# Patient Record
Sex: Male | Born: 1975 | Race: White | Hispanic: No | Marital: Married | Smoking: Former smoker
Health system: Southern US, Community
[De-identification: ages and names within clinical notes are randomized; demographics above are authoritative.]

## PROBLEM LIST (undated history)

## (undated) DIAGNOSIS — A770 Spotted fever due to Rickettsia rickettsii: Secondary | ICD-10-CM

## (undated) DIAGNOSIS — I2510 Atherosclerotic heart disease of native coronary artery without angina pectoris: Secondary | ICD-10-CM

## (undated) DIAGNOSIS — E669 Obesity, unspecified: Secondary | ICD-10-CM

## (undated) DIAGNOSIS — I341 Nonrheumatic mitral (valve) prolapse: Secondary | ICD-10-CM

## (undated) DIAGNOSIS — W57XXXA Bitten or stung by nonvenomous insect and other nonvenomous arthropods, initial encounter: Secondary | ICD-10-CM

## (undated) HISTORY — DX: Atherosclerotic heart disease of native coronary artery without angina pectoris: I25.10

## (undated) HISTORY — DX: Nonrheumatic mitral (valve) prolapse: I34.1

## (undated) HISTORY — DX: Obesity, unspecified: E66.9

---

## 2016-11-18 ENCOUNTER — Emergency Department: Payer: BLUE CROSS/BLUE SHIELD

## 2016-11-18 ENCOUNTER — Inpatient Hospital Stay
Admission: EM | Admit: 2016-11-18 | Discharge: 2016-11-20 | DRG: 247 | Disposition: A | Discharge status: Home / Self Care | Payer: BLUE CROSS/BLUE SHIELD | Attending: Internal Medicine | Admitting: Internal Medicine

## 2016-11-18 ENCOUNTER — Encounter: Payer: Self-pay | Admitting: Emergency Medicine

## 2016-11-18 ENCOUNTER — Inpatient Hospital Stay
Admit: 2016-11-18 | Discharge: 2016-11-18 | Disposition: A | Discharge status: Home / Self Care | Payer: BLUE CROSS/BLUE SHIELD | Attending: Internal Medicine | Admitting: Internal Medicine

## 2016-11-18 DIAGNOSIS — I2510 Atherosclerotic heart disease of native coronary artery without angina pectoris: Secondary | ICD-10-CM | POA: Diagnosis present

## 2016-11-18 DIAGNOSIS — E785 Hyperlipidemia, unspecified: Secondary | ICD-10-CM | POA: Diagnosis present

## 2016-11-18 DIAGNOSIS — Z87891 Personal history of nicotine dependence: Secondary | ICD-10-CM

## 2016-11-18 DIAGNOSIS — I214 Non-ST elevation (NSTEMI) myocardial infarction: Secondary | ICD-10-CM | POA: Diagnosis present

## 2016-11-18 DIAGNOSIS — R739 Hyperglycemia, unspecified: Secondary | ICD-10-CM | POA: Diagnosis present

## 2016-11-18 DIAGNOSIS — I208 Other forms of angina pectoris: Secondary | ICD-10-CM | POA: Diagnosis not present

## 2016-11-18 DIAGNOSIS — Z8249 Family history of ischemic heart disease and other diseases of the circulatory system: Secondary | ICD-10-CM | POA: Diagnosis not present

## 2016-11-18 DIAGNOSIS — R072 Precordial pain: Secondary | ICD-10-CM | POA: Diagnosis not present

## 2016-11-18 HISTORY — DX: Bitten or stung by nonvenomous insect and other nonvenomous arthropods, initial encounter: W57.XXXA

## 2016-11-18 HISTORY — DX: Spotted fever due to Rickettsia rickettsii: A77.0

## 2016-11-18 LAB — COMPREHENSIVE METABOLIC PANEL
ALBUMIN: 4.2 g/dL (ref 3.5–5.0)
ALK PHOS: 53 U/L (ref 38–126)
ALT: 73 U/L — ABNORMAL HIGH (ref 17–63)
ANION GAP: 9 (ref 5–15)
AST: 72 U/L — AB (ref 15–41)
BILIRUBIN TOTAL: 0.5 mg/dL (ref 0.3–1.2)
BUN: 10 mg/dL (ref 6–20)
CALCIUM: 10.3 mg/dL (ref 8.9–10.3)
CO2: 29 mmol/L (ref 22–32)
Chloride: 101 mmol/L (ref 101–111)
Creatinine, Ser: 1.03 mg/dL (ref 0.61–1.24)
GFR calc Af Amer: >60 mL/min (ref >60)
GFR calc non Af Amer: >60 mL/min (ref >60)
GLUCOSE: 134 mg/dL — AB (ref 65–99)
Potassium: 4.3 mmol/L (ref 3.5–5.1)
SODIUM: 139 mmol/L (ref 135–145)
TOTAL PROTEIN: 7.6 g/dL (ref 6.5–8.1)

## 2016-11-18 LAB — LIPASE, BLOOD: Lipase: 26 U/L (ref 11–51)

## 2016-11-18 LAB — CBC WITH DIFFERENTIAL/PLATELET
BASOS ABS: 0 10*3/uL (ref 0–0.1)
BASOS PCT: 1 %
EOS ABS: 0 10*3/uL (ref 0–0.7)
Eosinophils Relative: 0 %
HEMATOCRIT: 44.1 % (ref 40.0–52.0)
HEMOGLOBIN: 15.2 g/dL (ref 13.0–18.0)
Lymphocytes Relative: 14 %
Lymphs Abs: 1 10*3/uL (ref 1.0–3.6)
MCH: 29.3 pg (ref 26.0–34.0)
MCHC: 34.6 g/dL (ref 32.0–36.0)
MCV: 84.8 fL (ref 80.0–100.0)
Monocytes Absolute: 1.2 10*3/uL — ABNORMAL HIGH (ref 0.2–1.0)
Monocytes Relative: 16 %
NEUTROS ABS: 5.3 10*3/uL (ref 1.4–6.5)
NEUTROS PCT: 69 %
Platelets: 173 10*3/uL (ref 150–440)
RBC: 5.2 MIL/uL (ref 4.40–5.90)
RDW: 13.5 % (ref 11.5–14.5)
WBC: 7.6 10*3/uL (ref 3.8–10.6)

## 2016-11-18 LAB — PROTIME-INR
INR: 1.02
PROTHROMBIN TIME: 13.4 s (ref 11.4–15.2)

## 2016-11-18 LAB — LIPID PANEL
Cholesterol: 157 mg/dL (ref 0–200)
HDL: 37 mg/dL — AB (ref >40)
LDL Cholesterol: 93 mg/dL (ref 0–99)
TRIGLYCERIDES: 137 mg/dL (ref <150)
Total CHOL/HDL Ratio: 4.2 RATIO
VLDL: 27 mg/dL (ref 0–40)

## 2016-11-18 LAB — APTT: APTT: 32 s (ref 24–36)

## 2016-11-18 LAB — TROPONIN I
TROPONIN I: 5.67 ng/mL — AB (ref <0.03)
Troponin I: 4.23 ng/mL (ref <0.03)
Troponin I: 4.85 ng/mL (ref <0.03)

## 2016-11-18 LAB — HEPARIN LEVEL (UNFRACTIONATED): HEPARIN UNFRACTIONATED: 0.2 [IU]/mL — AB (ref 0.30–0.70)

## 2016-11-18 IMAGING — CR DG CHEST 2V
2 series · 2 of 2 positions shown · non-contrast
Comparison: None.

CLINICAL DATA: Chest pain

EXAM:
CHEST  2 VIEW

[chest pa]
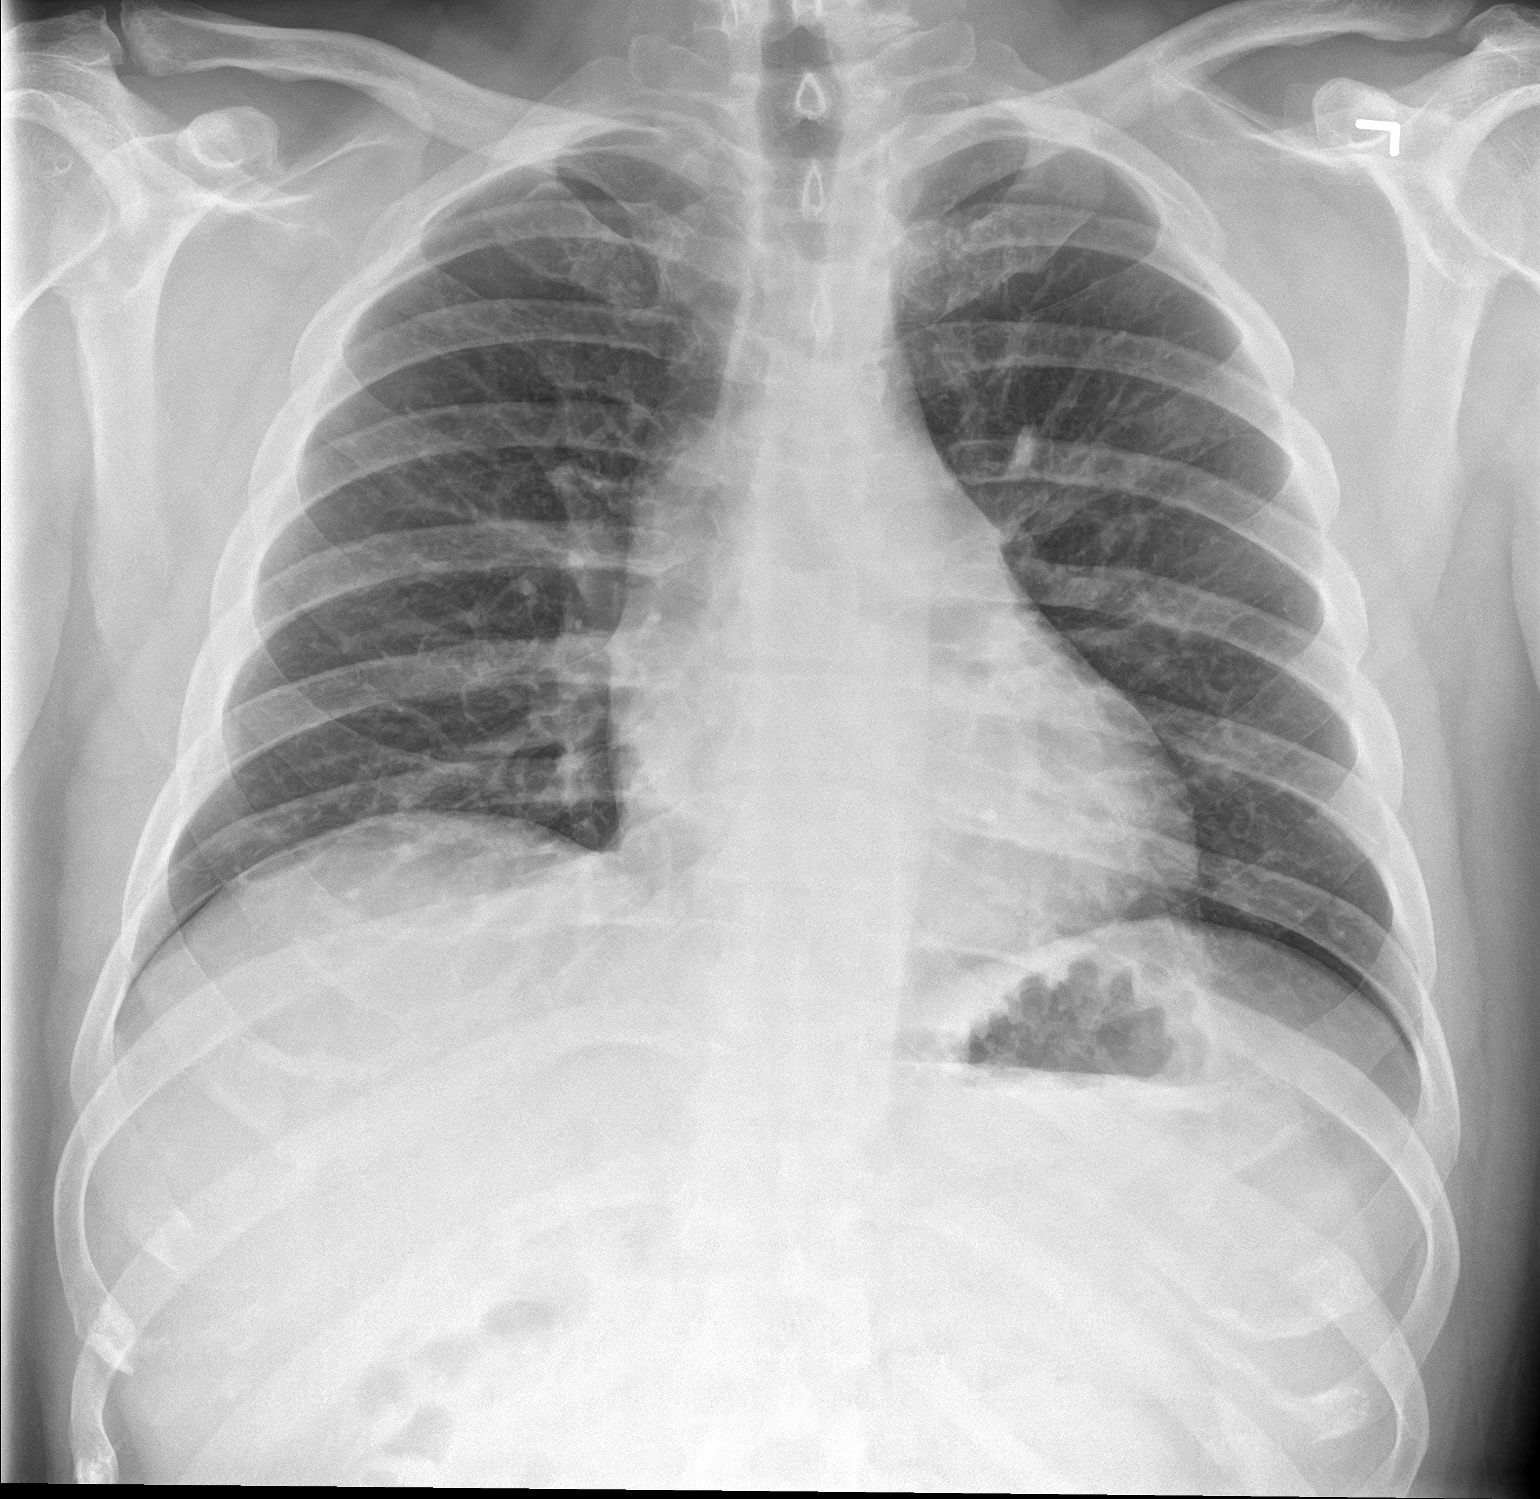

[chest lat]
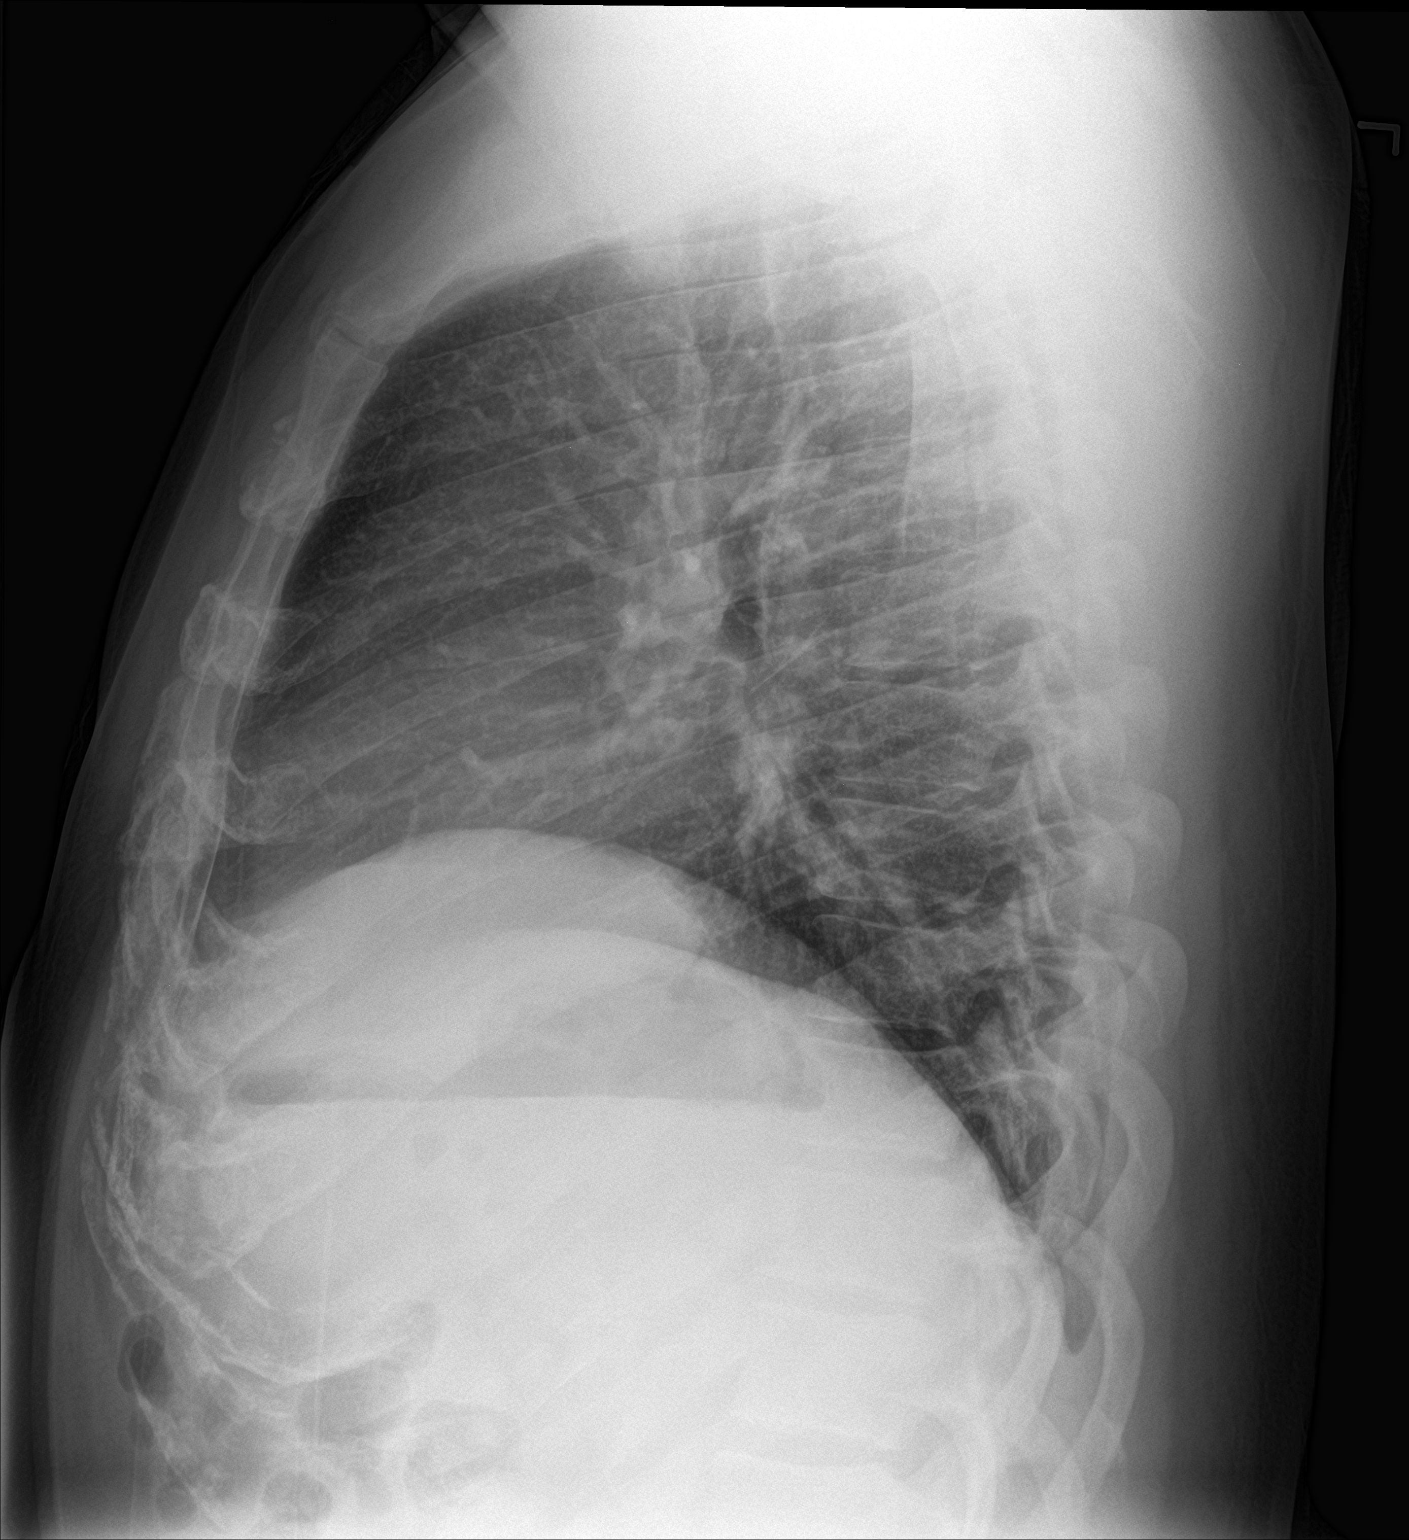

[2 of 2 positions shown; findings below may reference images not displayed]

FINDINGS: The heart size and mediastinal contours are within normal limits.
Both lungs are clear. The visualized skeletal structures are
unremarkable.
IMPRESSION: No active cardiopulmonary disease.

## 2016-11-18 MED ORDER — METOPROLOL TARTRATE 25 MG PO TABS
12.5000 mg | ORAL_TABLET | Freq: Two times a day (BID) | ORAL | Status: DC
  Administered 2016-11-18 – 2016-11-20 (×4): 12.5 mg via ORAL
  Filled 2016-11-18 (×4): qty 1

## 2016-11-18 MED ORDER — SODIUM CHLORIDE 0.9 % IV SOLN: 250.0000 mL | INTRAVENOUS | Status: DC | PRN

## 2016-11-18 MED ORDER — ATORVASTATIN CALCIUM 20 MG PO TABS
80.0000 mg | ORAL_TABLET | Freq: Every day | ORAL | Status: DC
  Administered 2016-11-18 – 2016-11-19 (×2): 80 mg via ORAL
  Filled 2016-11-18 (×2): qty 4

## 2016-11-18 MED ORDER — SODIUM CHLORIDE 0.9 % WEIGHT BASED INFUSION
3.0000 mL/kg/h | INTRAVENOUS | Status: AC
  Administered 2016-11-19: 3 mL/kg/h via INTRAVENOUS

## 2016-11-18 MED ORDER — NITROGLYCERIN 0.4 MG SL SUBL: 0.4000 mg | SUBLINGUAL_TABLET | SUBLINGUAL | Status: DC | PRN

## 2016-11-18 MED ORDER — HEPARIN (PORCINE) IN NACL 100-0.45 UNIT/ML-% IJ SOLN
13.0000 [IU]/kg/h | INTRAMUSCULAR | Status: DC
  Administered 2016-11-18: 13 [IU]/kg/h via INTRAVENOUS
  Filled 2016-11-18: qty 250

## 2016-11-18 MED ORDER — FAMOTIDINE IN NACL 20-0.9 MG/50ML-% IV SOLN
20.0000 mg | Freq: Once | INTRAVENOUS | Status: AC
  Administered 2016-11-18: 20 mg via INTRAVENOUS
  Filled 2016-11-18: qty 50

## 2016-11-18 MED ORDER — ASPIRIN 81 MG PO CHEW
81.0000 mg | CHEWABLE_TABLET | ORAL | Status: AC
  Administered 2016-11-19: 81 mg via ORAL
  Filled 2016-11-18: qty 1

## 2016-11-18 MED ORDER — ACETAMINOPHEN 325 MG PO TABS: 650.0000 mg | ORAL_TABLET | Freq: Four times a day (QID) | ORAL | Status: DC | PRN

## 2016-11-18 MED ORDER — ASPIRIN 81 MG PO CHEW: 81.0000 mg | CHEWABLE_TABLET | Freq: Every day | ORAL | Status: DC

## 2016-11-18 MED ORDER — ASPIRIN 81 MG PO CHEW
81.0000 mg | CHEWABLE_TABLET | Freq: Every day | ORAL | Status: DC
  Administered 2016-11-19 – 2016-11-20 (×2): 81 mg via ORAL
  Filled 2016-11-18 (×2): qty 1

## 2016-11-18 MED ORDER — ONDANSETRON HCL 4 MG/2ML IJ SOLN: 4.0000 mg | Freq: Four times a day (QID) | INTRAMUSCULAR | Status: DC | PRN

## 2016-11-18 MED ORDER — ALUM & MAG HYDROXIDE-SIMETH 200-200-20 MG/5ML PO SUSP
30.0000 mL | Freq: Four times a day (QID) | ORAL | Status: DC | PRN
Start: 2016-11-18 — End: 2016-11-20

## 2016-11-18 MED ORDER — ASPIRIN 81 MG PO CHEW
324.0000 mg | CHEWABLE_TABLET | Freq: Once | ORAL | Status: AC
  Administered 2016-11-18: 324 mg via ORAL
  Filled 2016-11-18: qty 4

## 2016-11-18 MED ORDER — HEPARIN (PORCINE) IN NACL 100-0.45 UNIT/ML-% IJ SOLN
1550.0000 [IU]/h | INTRAMUSCULAR | Status: DC
  Administered 2016-11-18 – 2016-11-19 (×2): 1550 [IU]/h via INTRAVENOUS
  Filled 2016-11-18: qty 250

## 2016-11-18 MED ORDER — ONDANSETRON HCL 4 MG/2ML IJ SOLN
4.0000 mg | Freq: Once | INTRAMUSCULAR | Status: AC
  Administered 2016-11-18: 4 mg via INTRAVENOUS
  Filled 2016-11-18: qty 2

## 2016-11-18 MED ORDER — HEPARIN BOLUS VIA INFUSION
1500.0000 [IU] | Freq: Once | INTRAVENOUS | Status: AC
  Administered 2016-11-18: 1500 [IU] via INTRAVENOUS
  Filled 2016-11-18: qty 1500

## 2016-11-18 MED ORDER — GI COCKTAIL ~~LOC~~
30.0000 mL | Freq: Once | ORAL | Status: AC
  Administered 2016-11-18: 30 mL via ORAL
  Filled 2016-11-18: qty 30

## 2016-11-18 MED ORDER — HEPARIN BOLUS VIA INFUSION
4000.0000 [IU] | Freq: Once | INTRAVENOUS | Status: AC
  Administered 2016-11-18: 4000 [IU] via INTRAVENOUS
  Filled 2016-11-18: qty 4000

## 2016-11-18 MED ORDER — TRAMADOL HCL 50 MG PO TABS: 50.0000 mg | ORAL_TABLET | Freq: Four times a day (QID) | ORAL | Status: DC | PRN

## 2016-11-18 MED ORDER — SODIUM CHLORIDE 0.9% FLUSH: 3.0000 mL | INTRAVENOUS | Status: DC | PRN

## 2016-11-18 MED ORDER — SODIUM CHLORIDE 0.9 % IV BOLUS (SEPSIS)
1000.0000 mL | Freq: Once | INTRAVENOUS | Status: AC
  Administered 2016-11-18: 1000 mL via INTRAVENOUS

## 2016-11-18 MED ORDER — PANTOPRAZOLE SODIUM 40 MG PO TBEC
40.0000 mg | DELAYED_RELEASE_TABLET | Freq: Every day | ORAL | Status: DC
  Administered 2016-11-18 – 2016-11-20 (×3): 40 mg via ORAL
  Filled 2016-11-18 (×2): qty 1

## 2016-11-18 MED ORDER — SODIUM CHLORIDE 0.9% FLUSH
3.0000 mL | Freq: Two times a day (BID) | INTRAVENOUS | Status: DC
  Administered 2016-11-18: 3 mL via INTRAVENOUS

## 2016-11-18 MED ORDER — SODIUM CHLORIDE 0.9 % WEIGHT BASED INFUSION: 1.0000 mL/kg/h | INTRAVENOUS | Status: DC

## 2016-11-18 MED ORDER — ACETAMINOPHEN 650 MG RE SUPP: 650.0000 mg | Freq: Four times a day (QID) | RECTAL | Status: DC | PRN

## 2016-11-18 MED ORDER — ONDANSETRON HCL 4 MG PO TABS: 4.0000 mg | ORAL_TABLET | Freq: Four times a day (QID) | ORAL | Status: DC | PRN

## 2016-11-18 NOTE — H&P (Signed)
[facility] - [place] at [facility]   PATIENT NAME: Victor Ibarra    MR#:  [id number]  DATE OF BIRTH:  1976-01-20  DATE OF ADMISSION:  11/18/2016  PRIMARY CARE PHYSICIAN: Patient, No Pcp Per   REQUESTING/REFERRING PHYSICIAN: Dr. Nita Sickle  CHIEF COMPLAINT:   Chief Complaint  Patient presents with  . Headache  . Fever  . Rash    HISTORY OF PRESENT ILLNESS:  Victor Ibarra  is a 41 y.o. male with No significant past medical history comes to the hospital secondary to worsening chest pain/burning sensation since yesterday. Patient is very healthy at baseline, independent and does exertional activity at his job without any complaints. 2 months ago he had tick bites with rash and fever and was treated with doxycycline. Did not have any complaints up until 4 days ago when he started having headaches and malaise. Low-grade fevers at home as well. Yesterday he felt like he had burning sensation in his chest and took some ibuprofen and Tums. Symptoms have resolved at the time but he woke up around 5 AM this morning with intense burning sensation in his chest associated with diaphoresis, presented to the ER. He was given GI cocktail with improvement. However his labs came back with elevated troponin of 4. EKG without any acute findings. He has significant family history with his dad being diagnosed with CAD in his early 39s. He started on heparin drip and is being admitted.  PAST MEDICAL HISTORY:   Past Medical History:  Diagnosis Date  . [facility] spotted fever    a. Dx 09/2016 - s/p 2 wks of doxycycline.  . Tick bite     PAST SURGICAL HISTORY:  History reviewed. No pertinent surgical history.  SOCIAL HISTORY:   Social History  Substance Use Topics  . Smoking status: Former Games developer  . Smokeless tobacco: Never Used     Comment: smoked some in high school - none since.  . Alcohol use Yes     Comment: 3-4 drinks on the weekends    FAMILY  HISTORY:   Family History  Problem Relation Age of Onset  . Other Mother        alive and well.  . Hypertension Mother   . CAD Father        s/p stenting in his 33's. Told he also had a CTO with prob prior silent MI.  . Melanoma Father   . Other Sister        alive and well.    DRUG ALLERGIES:  No Known Allergies  REVIEW OF SYSTEMS:   Review of Systems  Constitutional: Positive for malaise/fatigue. Negative for chills, fever and weight loss.  HENT: Negative for ear discharge, ear pain, hearing loss, nosebleeds and tinnitus.   Eyes: Negative for blurred vision, double vision and photophobia.  Respiratory: Negative for cough, hemoptysis, shortness of breath and wheezing.   Cardiovascular: Positive for chest pain. Negative for palpitations, orthopnea and leg swelling.  Gastrointestinal: Positive for heartburn. Negative for abdominal pain, constipation, diarrhea, melena, nausea and vomiting.  Genitourinary: Negative for dysuria, frequency, hematuria and urgency.  Musculoskeletal: Negative for back pain, myalgias and neck pain.  Skin: Negative for rash.  Neurological: Positive for headaches. Negative for dizziness, tingling, tremors, sensory change, speech change and focal weakness.  Endo/Heme/Allergies: Does not bruise/bleed easily.  Psychiatric/Behavioral: Negative for depression.    MEDICATIONS AT HOME:   Prior to Admission medications   Not on File      VITAL  SIGNS:  Blood pressure 127/84, pulse 65, temperature 98.9 F (37.2 C), temperature source Oral, resp. rate 18, height 6' (1.829 m), weight 117.9 kg (260 lb), SpO2 95 %.  PHYSICAL EXAMINATION:   Physical Exam  GENERAL:  41 y.o.-year-old patient lying in the bed with no acute distress.  EYES: Pupils equal, round, reactive to light and accommodation. No scleral icterus. Extraocular muscles intact.  HEENT: Head atraumatic, normocephalic. Oropharynx and nasopharynx clear.  NECK:  Supple, no jugular venous  distention. No thyroid enlargement, no tenderness.  LUNGS: Normal breath sounds bilaterally, no wheezing, rales,rhonchi or crepitation. No use of accessory muscles of respiration.  CARDIOVASCULAR: S1, S2 normal. No murmurs, rubs, or gallops.  ABDOMEN: Soft, nontender, nondistended. Bowel sounds present. No organomegaly or mass.  EXTREMITIES: No pedal edema, cyanosis, or clubbing.  NEUROLOGIC: Cranial nerves II through XII are intact. Muscle strength 5/5 in all extremities. Sensation intact. Gait not checked.  PSYCHIATRIC: The patient is alert and oriented x 3.  SKIN: No obvious rash, lesion, or ulcer.   LABORATORY PANEL:   CBC  Recent Labs Lab 11/18/16 0806  WBC 7.6  HGB 15.2  HCT 44.1  PLT 173   ------------------------------------------------------------------------------------------------------------------  Chemistries   Recent Labs Lab 11/18/16 0806  NA 139  K 4.3  CL 101  CO2 29  GLUCOSE 134*  BUN 10  CREATININE 1.03  CALCIUM 10.3  AST 72*  ALT 73*  ALKPHOS 53  BILITOT 0.5   ------------------------------------------------------------------------------------------------------------------  Cardiac Enzymes  Recent Labs Lab 11/18/16 0806  TROPONINI 4.23*   ------------------------------------------------------------------------------------------------------------------  RADIOLOGY:  Dg Chest 2 View  Result Date: 11/18/2016 CLINICAL DATA:  Chest pain EXAM: CHEST  2 VIEW COMPARISON:  None. FINDINGS: The heart size and mediastinal contours are within normal limits. Both lungs are clear. The visualized skeletal structures are unremarkable. IMPRESSION: No active cardiopulmonary disease. Electronically Signed   By: Alcide CleverMark  Lukens M.D.   On: 11/18/2016 08:37    EKG:   Orders placed or performed during the hospital encounter of 11/18/16  . EKG 12-Lead  . EKG 12-Lead  . ED EKG  . ED EKG  . EKG 12-Lead  . EKG 12-Lead    IMPRESSION AND PLAN:   Victor CobbWilliam  Ibarra  is a 41 y.o. male with No significant past medical history comes to the hospital secondary to worsening chest pain/burning sensation since yesterday.  #1 NSTEMI- strong family history, admit to tele - recycle troponins, lipid panel ordered - start asa and nitro prn - cardiology consulted, ECHO today - for cath in AM  #2 history of [place] spotted fever-treated with doxycycline in May 2018. - no active rash or fevers now  #3 DVT Prophylaxis- on heparin drip    All the records are reviewed and case discussed with ED provider. Management plans discussed with the patient, family and they are in agreement.  CODE STATUS: Full Code  TOTAL TIME TAKING CARE OF THIS PATIENT: 45 minutes.    Enid BaasKALISETTI,Abdoulaye Drum M.D on 11/18/2016 at 12:51 PM  Between 7am to 6pm - Pager - [phone]  After 6pm go to [email or web address] - password [email or web address]  [facility]  Office  [phone]  CC: Primary care physician; Patient, No Pcp Per

## 2016-11-18 NOTE — Consult Note (Signed)
Cardiology Consult    Patient ID: [name] MRN: [id number], DOB/AGE: [birth year]   Admit date: 11/18/2016 Date of Consult: 11/18/2016  Primary Physician: Patient, No Pcp Per Primary Cardiologist: New - seen by M. Kirke Corin, MD  Requesting Provider: Henreitta Leber  Patient Profile    Victor Ibarra. is a 41 y.o. male without prior cardiac history, who is being seen today for the evaluation of chest pain/NSTEMI at the request of Dr. Henreitta Leber.  Past Medical History   Past Medical History:  Diagnosis Date  . [facility] spotted fever    a. Dx 09/2016 - s/p 2 wks of doxycycline.  . Tick bite     History reviewed. No pertinent surgical history.   Allergies  No Known Allergies  History of Present Illness    41 y/o male without prior cardiac history.  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  He exerts heavily @ work and has noted that over the past 2 years, he has had some reduction in exercise tolerance and DOE.  He attributed this to getting older.  In late May, he found a tick on him and subsequently developed severe malaise and flu-like symptoms.  He was seen by his PCP within 2 days and was treated with doxycycline and OTC analgesics.  Following recovery, he did well and returned to work.  Beginning on ~ 7/12 however, he again noted malaise and fatigue.  He was concerned that he may have had another tick bite.  He continued to feel poorly over the weekend and used some OTC ibuprofen, which didn't seem to help much.  On 7/15, he had an episode of chest burning some time around 1500, though this resolved pretty quickly.  At 2200, he had more severe chest burning.  He took tums without relief and then took additional tums around 2300.  Symptoms finally abated around 0300 this morning and he was able to fall off to sleep.  Unfortunately, he awoke @ 0500 this am with recurrent chest burning.  He finally decided to come into the ED.  Here,  ECG showed subtle inferolateral ST changes.  Initial troponin was elevated @ 4.23.  He was placed on heparin and is currently chest pain free.  We have been asked to eval.  Inpatient Medications    . pantoprazole  40 mg Oral Daily    Family History    Family History  Problem Relation Age of Onset  . Other Mother        alive and well.  Marland Kitchen CAD Father        s/p stenting in his 50's. Told he also had a CTO with prob prior silent MI.  . Other Sister        alive and well.    Social History    Social History   Social History  . Marital status: Married    Spouse name: N/A  . Number of children: N/A  . Years of education: N/A   Occupational History  . [employer]    Social History Main Topics  . Smoking status: Former Games developer  . Smokeless tobacco: Never Used     Comment: smoked some in high school - none since.  . Alcohol use Yes     Comment: 3-4 drinks on the weekends  . Drug use: No  . Sexual activity: Not on file   Other Topics Concern  . Not on file   Social History  Narrative   [place]ives in [place].  Works as Landtobacco farmer - heavy exertion.     Review of Systems    General:  No chills, fever, night sweats or weight changes.  Cardiovascular:  +++ chest pain, +++ dyspnea on exertion, no edema, orthopnea, palpitations, paroxysmal nocturnal dyspnea. Dermatological: No rash, lesions/masses Respiratory: No cough, +++ dyspnea on exertion. Urologic: No hematuria, dysuria Abdominal:   No nausea, vomiting, diarrhea, bright red blood per rectum, melena, or hematemesis Neurologic:  No visual changes, wkns, changes in mental status. All other systems reviewed and are otherwise negative except as noted above.  Physical Exam    Blood pressure 127/84, pulse 65, temperature 98.9 F (37.2 C), temperature source Oral, resp. rate 18, height 6' (1.829 m), weight 260 lb (117.9 kg), SpO2 95 %.  General: Pleasant, NAD Psych: Normal affect. Neuro: Alert and oriented X  3. Moves all extremities spontaneously. HEENT: Normal  Neck: Supple without bruits or JVD. Lungs:  Resp regular and unlabored, CTA. Heart: RRR no s3, s4, or murmurs. Abdomen: Soft, non-tender, non-distended, BS + x 4.  Extremities: No clubbing, cyanosis or edema. DP/PT/Radials 2+ and equal bilaterally.  Nl Allen's on right.  Labs    Recent Labs  11/18/16 0806  TROPONINI 4.23*   Lab Results  Component Value Date   WBC 7.6 11/18/2016   HGB 15.2 11/18/2016   HCT 44.1 11/18/2016   MCV 84.8 11/18/2016   PLT 173 11/18/2016     Recent Labs Lab 11/18/16 0806  NA 139  K 4.3  CL 101  CO2 29  BUN 10  CREATININE 1.03  CALCIUM 10.3  PROT 7.6  BILITOT 0.5  ALKPHOS 53  ALT 73*  AST 72*  GLUCOSE 134*   Lab Results  Component Value Date   CHOL 157 11/18/2016   HDL 37 (L) 11/18/2016   LDLCALC 93 11/18/2016   TRIG 137 11/18/2016     Radiology Studies    Dg Chest 2 View  Result Date: 11/18/2016 CLINICAL DATA:  Chest pain EXAM: CHEST  2 VIEW COMPARISON:  None. FINDINGS: The heart size and mediastinal contours are within normal limits. Both lungs are clear. The visualized skeletal structures are unremarkable. IMPRESSION: No active cardiopulmonary disease. Electronically Signed   By: Alcide CleverMark  Lukens M.D.   On: 11/18/2016 08:37    ECG & Cardiac Imaging    RSR, 67, LAE, subtle St elev in II, aVF, V5-6.  Assessment & Plan    1.  NSTEMI:  Pt w/o prior cardiac hx, presented to the ED with a 4 day h/o malaise and fatigue followed by chest discomfort that worsened throughout the night.  Initial troponin in ED was 4.23.  He is currently chest pain free.  He does have a FH of premature CAD (father).  We discussed his presentation and objective findings at length.  We will plan on diagnostic catheterization in the AM.  The patient understands that risks include but are not limited to stroke (1 in 1000), death (1 in 1000), kidney failure [usually temporary] (1 in 500), bleeding (1 in 200),  allergic reaction [possibly serious] (1 in 200), and agrees to proceed.  Cont heparin, ASA.  I will add low dose  blocker and high potency statin.  Echo pending.  Eventual cardiac rehab.  2.  Lipids:  LDL 93  adding high potency statin in the setting of above.  Signed, Victor Duckinghristopher Breckyn Troyer, NP 11/18/2016, 12:41 PM

## 2016-11-18 NOTE — ED Notes (Signed)
Attempted to call report, nurse and charge nurse are unavailable.

## 2016-11-18 NOTE — ED Notes (Signed)
NSR noted on the monitor. Pt denies any pain at present. Will continue to monitor the pt.

## 2016-11-18 NOTE — ED Triage Notes (Signed)
Pt reports recently treated for RMSF. Pt has finished medication. Pt reports yesterday began to feel bad again. Pt reports headache, fever and burning in chest. Pt reports rash to right hip that looks like a red circle with brown in the middle.

## 2016-11-18 NOTE — Progress Notes (Signed)
ANTICOAGULATION CONSULT NOTE  Pharmacy Consult for Heparin Indication: chest pain/ACS  No Known Allergies  Patient Measurements: Height: 6' (182.9 cm) Weight: 260 lb (117.9 kg) IBW/kg (Calculated) : 77.6 Heparin Dosing Weight: 103.3 kg  Vital Signs: Temp: 98.9 F (37.2 C) (07/16 0753) Temp Source: Oral (07/16 0753) BP: 140/93 (07/16 0753) Pulse Rate: 73 (07/16 0753)  Labs:  Recent Labs  11/18/16 0806  HGB 15.2  HCT 44.1  PLT 173  CREATININE 1.03  TROPONINI 4.23*    Estimated Creatinine Clearance: 123.8 mL/min (by C-G formula based on SCr of 1.03 mg/dL).   Medical History: History reviewed. No pertinent past medical history.  Assessment: 41 y/o M with no PMH admitted with chest pain.   Goal of Therapy:  Heparin level 0.3-0.7 units/ml Monitor platelets by anticoagulation protocol: Yes   Plan:  Give 4000 units bolus x 1 Start heparin infusion at 1350 units/hr Check anti-Xa level in 6 hours and daily while on heparin Continue to monitor H&H and platelets  Luisa HartChristy, Mirella Gueye D 11/18/2016,9:00 AM

## 2016-11-18 NOTE — ED Provider Notes (Signed)
[facility] Emergency Department Provider Note  ____________________________________________  Time seen: Approximately 8:49 AM  I have reviewed the triage vital signs and the nursing notes.   HISTORY  Chief Complaint Headache; Fever; and Rash   HPI Victor Ibarra. is a 41 y.o. male with no significant past medical history who presents for evaluation of chest pain. Patient reports that yesterday evening he started having burning sensation in the center of his chest. He took 5 or 6 Tums and the pain went away. This morning when he woke up the pain was present again. He reports that the pain is different than prior episodes of reflux. He is currently complaining of moderate burning sensation located in the center of his chest that is nonradiating. No shortness of breath, no diaphoresis, no nausea or vomiting. Patient does endorse that he has taken 6 ibuprofen yesterday for headache and a temp of 100F. he also took them on an empty stomach. Patient denies history of peptic ulcer disease. He doesn't family history of heart attack in his bed. He is not a smoker.Patient also complaining that he has had a mild headache and a low-grade fever yesterday. He has a spot in his back and he is concerned could have been a tick bite. Patient was treated for RMSF fever 2 months ago.  History reviewed. No pertinent past medical history.  There are no active problems to display for this patient.   History reviewed. No pertinent surgical history.  Prior to Admission medications   Not on File    Allergies Patient has no known allergies.  No family history on file.  Social History Social History  Substance Use Topics  . Smoking status: Never Smoker  . Smokeless tobacco: Never Used  . Alcohol use Yes    Review of Systems  Constitutional: Negative for fever. Eyes: Negative for visual changes. ENT: Negative for sore throat. Neck: No neck pain    Cardiovascular: + chest pain. Respiratory: Negative for shortness of breath. Gastrointestinal: Negative for abdominal pain, vomiting or diarrhea. Genitourinary: Negative for dysuria. Musculoskeletal: Negative for back pain. Skin: Negative for rash. Neurological: Negative for headaches, weakness or numbness. Psych: No SI or HI  ____________________________________________   PHYSICAL EXAM:  VITAL SIGNS: ED Triage Vitals  Enc Vitals Group     BP 11/18/16 0753 (!) 140/93     Pulse Rate 11/18/16 0753 73     Resp 11/18/16 0753 18     Temp 11/18/16 0753 98.9 F (37.2 C)     Temp Source 11/18/16 0753 Oral     SpO2 11/18/16 0753 99 %     Weight 11/18/16 0753 260 lb (117.9 kg)     Height 11/18/16 0753 6' (1.829 m)     Head Circumference --      Peak Flow --      Pain Score 11/18/16 0752 4     Pain Loc --      Pain Edu? --      Excl. in GC? --     Constitutional: Alert and oriented. Well appearing and in no apparent distress. HEENT:      Head: Normocephalic and atraumatic.         Eyes: Conjunctivae are normal. Sclera is non-icteric.       Mouth/Throat: Mucous membranes are moist.       Neck: Supple with no signs of meningismus. Cardiovascular: Regular rate and rhythm. No murmurs, gallops, or rubs. 2+ symmetrical distal pulses are present in  all extremities. No JVD. Respiratory: Normal respiratory effort. Lungs are clear to auscultation bilaterally. No wheezes, crackles, or rhonchi.  Gastrointestinal: Soft, non tender, and non distended with positive bowel sounds. No rebound or guarding. Genitourinary: No CVA tenderness. Musculoskeletal: Nontender with normal range of motion in all extremities. No edema, cyanosis, or erythema of extremities. Neurologic: Normal speech and language. Face is symmetric. Moving all extremities. No gross focal neurologic deficits are appreciated. Skin: Skin is warm, dry and intact. No rash noted. Psychiatric: Mood and affect are normal. Speech and  behavior are normal.  ____________________________________________   LABS (all labs ordered are listed, but only abnormal results are displayed)  Labs Reviewed  CBC WITH DIFFERENTIAL/PLATELET - Abnormal; Notable for the following:       Result Value   Monocytes Absolute 1.2 (*)    All other components within normal limits  COMPREHENSIVE METABOLIC PANEL - Abnormal; Notable for the following:    Glucose, Bld 134 (*)    AST 72 (*)    ALT 73 (*)    All other components within normal limits  TROPONIN I - Abnormal; Notable for the following:    Troponin I 4.23 (*)    All other components within normal limits  LIPASE, BLOOD   ____________________________________________  EKG  ED ECG REPORT I, Nita Sickle, the attending physician, personally viewed and interpreted this ECG.  07:55 - normal sinus rhythm, rate of 67, normal intervals, normal axis, no ST elevations or depressions, T-wave inversion in lead 3  08:42 -  normal sinus rhythm, rate of 72, normal intervals, normal axis, persistent T wave inversion in lead 3, no ST elevations or depressions  ____________________________________________  RADIOLOGY  CXR: Negative  ____________________________________________   PROCEDURES  Procedure(s) performed: None Procedures Critical Care performed:  Yes  CRITICAL CARE Performed by: Nita Sickle  ?  Total critical care time: 35 min  Critical care time was exclusive of separately billable procedures and treating other patients.  Critical care was necessary to treat or prevent imminent or life-threatening deterioration.  Critical care was time spent personally by me on the following activities: development of treatment plan with patient and/or surrogate as well as nursing, discussions with consultants, evaluation of patient's response to treatment, examination of patient, obtaining history from patient or surrogate, ordering and performing treatments and interventions,  ordering and review of laboratory studies, ordering and review of radiographic studies, pulse oximetry and re-evaluation of patient's condition.  ____________________________________________   INITIAL IMPRESSION / ASSESSMENT AND PLAN / ED COURSE  41 y.o. male with no significant past medical history who presents for evaluation of chest burning in the setting of taking NSAIDs yesterday on an empty stomach. EKG with no ischemic changes. Patient currently complaining of a 5 out of 10 pain. We'll give GI cocktail, Pepcid, Zofran, IV fluids. We'll cycle cardiac enzymes due to patient's family history of heart disease.   _________________________ 8:49 AM on 11/18/2016 ----------------------------------------- Troponin elevated at 4.23. Patient is pain free after GI cocktail. A repeat EKG with no ischemic changes. Discussed with the hospitalist for admission. Patient given full dose aspirin. Discussed with Dr. Kirke Corin, cardiologist on call who recommended starting patient on a heparin drip. Patient be admitted for NSTEMI     Pertinent labs & imaging results that were available during my care of the patient were reviewed by me and considered in my medical decision making (see chart for details).    ____________________________________________   FINAL CLINICAL IMPRESSION(S) / ED DIAGNOSES  Final diagnoses:  NSTEMI (non-ST elevated myocardial infarction) (HCC)      NEW MEDICATIONS STARTED DURING THIS VISIT:  New Prescriptions   No medications on file     Note:  This document was prepared using Dragon voice recognition software and may include unintentional dictation errors.    Don PerkingVeronese, [place], MD 11/18/16 0900

## 2016-11-18 NOTE — Progress Notes (Signed)
ANTICOAGULATION CONSULT NOTE  Pharmacy Consult for Heparin Indication: chest pain/ACS  No Known Allergies  Patient Measurements: Height: 6' (182.9 cm) Weight: 260 lb (117.9 kg) IBW/kg (Calculated) : 77.6 Heparin Dosing Weight: 103.3 kg  Vital Signs: Temp: 98.9 F (37.2 C) (07/16 1151) Temp Source: Oral (07/16 1151) BP: 127/84 (07/16 1151) Pulse Rate: 65 (07/16 1151)  Labs:  Recent Labs  11/18/16 0806 11/18/16 0908 11/18/16 1315 11/18/16 1600  HGB 15.2  --   --   --   HCT 44.1  --   --   --   PLT 173  --   --   --   APTT  --  32  --   --   LABPROT  --  13.4  --   --   INR  --  1.02  --   --   HEPARINUNFRC  --   --   --  0.20*  CREATININE 1.03  --   --   --   TROPONINI 4.23*  --  5.67*  --     Estimated Creatinine Clearance: 123.8 mL/min (by C-G formula based on SCr of 1.03 mg/dL).   Medical History: Past Medical History:  Diagnosis Date  . [facility]    a. Dx 09/2016 - s/p 2 wks of doxycycline.  . Tick bite     Assessment: 41 y/o M with no PMH admitted with chest pain.   Goal of Therapy:  Heparin level 0.3-0.7 units/ml Monitor platelets by anticoagulation protocol: Yes   Plan:  Heparin level low at 0.20. Will bolus 1500 units and increase rate to 1550 units/hr. Recheck HL in 6 hours.  Olene FlossMelissa D Gearldean Lomanto, Pharm.D, BCPS Clinical Pharmacist  11/18/2016,4:52 PM

## 2016-11-19 ENCOUNTER — Encounter: Payer: Self-pay | Admitting: *Deleted

## 2016-11-19 ENCOUNTER — Encounter
Admission: EM | Disposition: A | Discharge status: Home / Self Care | Payer: Self-pay | Source: Home / Self Care | Attending: Internal Medicine

## 2016-11-19 DIAGNOSIS — R072 Precordial pain: Secondary | ICD-10-CM

## 2016-11-19 DIAGNOSIS — R739 Hyperglycemia, unspecified: Secondary | ICD-10-CM

## 2016-11-19 DIAGNOSIS — I2510 Atherosclerotic heart disease of native coronary artery without angina pectoris: Secondary | ICD-10-CM

## 2016-11-19 HISTORY — PX: CORONARY STENT INTERVENTION: CATH118234

## 2016-11-19 HISTORY — PX: LEFT HEART CATH AND CORONARY ANGIOGRAPHY: CATH118249

## 2016-11-19 HISTORY — PX: INTRAVASCULAR PRESSURE WIRE/FFR STUDY: CATH118243

## 2016-11-19 LAB — BASIC METABOLIC PANEL
ANION GAP: 7 (ref 5–15)
BUN: 12 mg/dL (ref 6–20)
CO2: 29 mmol/L (ref 22–32)
Calcium: 8.9 mg/dL (ref 8.9–10.3)
Chloride: 102 mmol/L (ref 101–111)
Creatinine, Ser: 1 mg/dL (ref 0.61–1.24)
GFR calc Af Amer: >60 mL/min (ref >60)
GLUCOSE: 103 mg/dL — AB (ref 65–99)
POTASSIUM: 3.9 mmol/L (ref 3.5–5.1)
SODIUM: 138 mmol/L (ref 135–145)

## 2016-11-19 LAB — ECHOCARDIOGRAM COMPLETE
HEIGHTINCHES: 72 in
Weight: 4160 oz

## 2016-11-19 LAB — HEPARIN LEVEL (UNFRACTIONATED)
HEPARIN UNFRACTIONATED: 0.33 [IU]/mL (ref 0.30–0.70)
Heparin Unfractionated: 0.35 IU/mL (ref 0.30–0.70)

## 2016-11-19 LAB — CBC
HEMATOCRIT: 41.9 % (ref 40.0–52.0)
HEMOGLOBIN: 14.1 g/dL (ref 13.0–18.0)
MCH: 29 pg (ref 26.0–34.0)
MCHC: 33.7 g/dL (ref 32.0–36.0)
MCV: 86.1 fL (ref 80.0–100.0)
Platelets: 167 10*3/uL (ref 150–440)
RBC: 4.87 MIL/uL (ref 4.40–5.90)
RDW: 13.7 % (ref 11.5–14.5)
WBC: 7.3 10*3/uL (ref 3.8–10.6)

## 2016-11-19 LAB — POCT ACTIVATED CLOTTING TIME
Activated Clotting Time: 252 seconds
Activated Clotting Time: 263 seconds

## 2016-11-19 LAB — TROPONIN I: TROPONIN I: 4.87 ng/mL — AB (ref <0.03)

## 2016-11-19 LAB — HIV ANTIBODY (ROUTINE TESTING W REFLEX): HIV Screen 4th Generation wRfx: NONREACTIVE

## 2016-11-19 SURGERY — LEFT HEART CATH AND CORONARY ANGIOGRAPHY
Anesthesia: Moderate Sedation

## 2016-11-19 MED ORDER — FENTANYL CITRATE (PF) 100 MCG/2ML IJ SOLN
INTRAMUSCULAR | Status: DC | PRN
  Administered 2016-11-19: 50 ug via INTRAVENOUS

## 2016-11-19 MED ORDER — FENTANYL CITRATE (PF) 100 MCG/2ML IJ SOLN
INTRAMUSCULAR | Status: AC
  Filled 2016-11-19: qty 2

## 2016-11-19 MED ORDER — SODIUM CHLORIDE 0.9 % IV SOLN: INTRAVENOUS | Status: AC

## 2016-11-19 MED ORDER — HEPARIN SODIUM (PORCINE) 1000 UNIT/ML IJ SOLN
INTRAMUSCULAR | Status: AC
  Filled 2016-11-19: qty 1

## 2016-11-19 MED ORDER — TICAGRELOR 90 MG PO TABS
ORAL_TABLET | ORAL | Status: DC | PRN
  Administered 2016-11-19: 180 mg via ORAL

## 2016-11-19 MED ORDER — SODIUM CHLORIDE 0.9% FLUSH: 3.0000 mL | INTRAVENOUS | Status: DC | PRN

## 2016-11-19 MED ORDER — VERAPAMIL HCL 2.5 MG/ML IV SOLN
INTRAVENOUS | Status: DC | PRN
  Administered 2016-11-19: 2.5 mg via INTRA_ARTERIAL

## 2016-11-19 MED ORDER — LABETALOL HCL 5 MG/ML IV SOLN: 10.0000 mg | INTRAVENOUS | Status: AC | PRN

## 2016-11-19 MED ORDER — SODIUM CHLORIDE 0.9% FLUSH
3.0000 mL | Freq: Two times a day (BID) | INTRAVENOUS | Status: DC
  Administered 2016-11-19: 3 mL via INTRAVENOUS

## 2016-11-19 MED ORDER — SODIUM CHLORIDE 0.9 % IV SOLN: 250.0000 mL | INTRAVENOUS | Status: DC | PRN

## 2016-11-19 MED ORDER — VERAPAMIL HCL 2.5 MG/ML IV SOLN
INTRAVENOUS | Status: AC
  Filled 2016-11-19: qty 2

## 2016-11-19 MED ORDER — NITROGLYCERIN 1 MG/10 ML FOR IR/CATH LAB
INTRA_ARTERIAL | Status: DC | PRN
  Administered 2016-11-19 (×2): 200 ug via INTRACORONARY

## 2016-11-19 MED ORDER — TICAGRELOR 90 MG PO TABS
ORAL_TABLET | ORAL | Status: AC
  Filled 2016-11-19: qty 2

## 2016-11-19 MED ORDER — NITROGLYCERIN 5 MG/ML IV SOLN
INTRAVENOUS | Status: AC
  Filled 2016-11-19: qty 10

## 2016-11-19 MED ORDER — HEPARIN (PORCINE) IN NACL 2-0.9 UNIT/ML-% IJ SOLN
INTRAMUSCULAR | Status: AC
  Filled 2016-11-19: qty 500

## 2016-11-19 MED ORDER — MIDAZOLAM HCL 2 MG/2ML IJ SOLN
INTRAMUSCULAR | Status: DC | PRN
Start: 2016-11-19 — End: 2016-11-19
  Administered 2016-11-19: 1 mg via INTRAVENOUS

## 2016-11-19 MED ORDER — ADENOSINE (DIAGNOSTIC) 140MCG/KG/MIN
INTRAVENOUS | Status: DC | PRN
  Administered 2016-11-19: 140 ug/kg/min via INTRAVENOUS

## 2016-11-19 MED ORDER — ENOXAPARIN SODIUM 40 MG/0.4ML ~~LOC~~ SOLN
40.0000 mg | SUBCUTANEOUS | Status: DC
Start: 2016-11-20 — End: 2016-11-20

## 2016-11-19 MED ORDER — HEPARIN SODIUM (PORCINE) 1000 UNIT/ML IJ SOLN
INTRAMUSCULAR | Status: DC | PRN
  Administered 2016-11-19: 5000 [IU] via INTRAVENOUS
  Administered 2016-11-19: 2000 [IU] via INTRAVENOUS
  Administered 2016-11-19: 6000 [IU] via INTRAVENOUS
  Administered 2016-11-19: 3000 [IU] via INTRAVENOUS

## 2016-11-19 MED ORDER — TICAGRELOR 90 MG PO TABS
90.0000 mg | ORAL_TABLET | Freq: Two times a day (BID) | ORAL | Status: DC
  Administered 2016-11-19 – 2016-11-20 (×2): 90 mg via ORAL
  Filled 2016-11-19 (×2): qty 1

## 2016-11-19 MED ORDER — HYDRALAZINE HCL 20 MG/ML IJ SOLN: 5.0000 mg | INTRAMUSCULAR | Status: AC | PRN

## 2016-11-19 MED ORDER — MIDAZOLAM HCL 2 MG/2ML IJ SOLN
INTRAMUSCULAR | Status: AC
  Filled 2016-11-19: qty 2

## 2016-11-19 MED ORDER — IOPAMIDOL (ISOVUE-300) INJECTION 61%
INTRAVENOUS | Status: DC | PRN
  Administered 2016-11-19: 305 mL via INTRA_ARTERIAL

## 2016-11-19 MED ORDER — ADENOSINE (DIAGNOSTIC) 3 MG/ML IV SOLN
INTRAVENOUS | Status: AC
  Filled 2016-11-19: qty 30

## 2016-11-19 SURGICAL SUPPLY — 15 items
BALLN TREK RX 2.25X12 (BALLOONS) ×2
BALLN ~~LOC~~ TREK RX 3.0X12 (BALLOONS) ×2
BALLOON TREK RX 2.25X12 (BALLOONS) ×1 IMPLANT
BALLOON ~~LOC~~ TREK RX 3.0X12 (BALLOONS) ×1 IMPLANT
CATH 5F 110X4 TIG (CATHETERS) ×2 IMPLANT
CATH INFINITI 5FR ANG PIGTAIL (CATHETERS) ×2 IMPLANT
CATH LAUNCHER 6FR EBU 3 (CATHETERS) ×2 IMPLANT
DEVICE INFLAT 30 PLUS (MISCELLANEOUS) ×2 IMPLANT
DEVICE RAD TR BAND REGULAR (VASCULAR PRODUCTS) ×2 IMPLANT
GLIDESHEATH SLEND SS 6F .021 (SHEATH) ×2 IMPLANT
KIT MANI 3VAL PERCEP (MISCELLANEOUS) ×2 IMPLANT
PACK CARDIAC CATH (CUSTOM PROCEDURE TRAY) ×2 IMPLANT
STENT XIENCE ALPINE RX 2.75X18 (Permanent Stent) ×2 IMPLANT
WIRE PRESSURE VERRATA (WIRE) ×2 IMPLANT
WIRE ROSEN-J .035X260CM (WIRE) ×2 IMPLANT

## 2016-11-19 NOTE — Interval H&P Note (Signed)
History and Physical Interval Note:  11/19/2016 9:38 AM  Victor BillWilliam Robert Baglio Jr.  has presented today for cardiac catheterization, with the diagnosis of NSTEMI. The various methods of treatment have been discussed with the patient and family. After consideration of risks, benefits and other options for treatment, the patient has consented to  Procedure(s): Left Heart Cath and Coronary Angiography (N/A) as a surgical intervention .  The patient's history has been reviewed, patient examined, no change in status, stable for surgery.  I have reviewed the patient's chart and labs.  Questions were answered to the patient's satisfaction.    Cath Lab Visit (complete for each Cath Lab visit)  Clinical Evaluation Leading to the Procedure:   ACS: Yes.    Non-ACS: N/A  Victor Ibarra

## 2016-11-19 NOTE — Progress Notes (Signed)
ANTICOAGULATION CONSULT NOTE  Pharmacy Consult for Heparin Indication: chest pain/ACS  No Known Allergies  Patient Measurements: Height: 6' (182.9 cm) Weight: 257 lb 8 oz (116.8 kg) IBW/kg (Calculated) : 77.6 Heparin Dosing Weight: 103.3 kg  Vital Signs: Temp: 98.7 F (37.1 C) (07/17 0509) Temp Source: Oral (07/17 0509) BP: 110/67 (07/17 0509) Pulse Rate: 67 (07/17 0509)  Labs:  Recent Labs  11/18/16 0806 11/18/16 0908 11/18/16 1315 11/18/16 1600 11/18/16 1821 11/19/16 0007 11/19/16 0604  HGB 15.2  --   --   --   --  14.1  --   HCT 44.1  --   --   --   --  41.9  --   PLT 173  --   --   --   --  167  --   APTT  --  32  --   --   --   --   --   LABPROT  --  13.4  --   --   --   --   --   INR  --  1.02  --   --   --   --   --   HEPARINUNFRC  --   --   --  0.20*  --  0.33 0.35  CREATININE 1.03  --   --   --   --  1.00  --   TROPONINI 4.23*  --  5.67*  --  4.85* 4.87*  --     Estimated Creatinine Clearance: 127 mL/min (by C-G formula based on SCr of 1 mg/dL).   Medical History: Past Medical History:  Diagnosis Date  . [facility]    a. Dx 09/2016 - s/p 2 wks of doxycycline.  . Tick bite     Assessment: 41 y/o M with no PMH admitted with chest pain.   Goal of Therapy:  Heparin level 0.3-0.7 units/ml Monitor platelets by anticoagulation protocol: Yes   Plan:  Heparin level low at 0.20. Will bolus 1500 units and increase rate to 1550 units/hr. Recheck HL in 6 hours.  7/17 @ 0000 HL 0.33 therapeutic. Will continue current rate and recheck next HL @ 0600.  7/17 @ 0600 HL 0.35 therapeutic. Will continue current rate and recheck next HL w/ am labs.  Thomasene Rippleavid Rory Xiang, PharmD, BCPS Clinical Pharmacist 11/19/2016

## 2016-11-19 NOTE — Plan of Care (Signed)
Problem: Pain Managment: Goal: General experience of comfort will improve Outcome: Progressing No complaints of pain this shift, will continue to monitor.  Problem: Tissue Perfusion: Goal: Risk factors for ineffective tissue perfusion will decrease Outcome: Progressing Heparin gtt

## 2016-11-19 NOTE — Progress Notes (Signed)
Patient Name: Victor Ibarra. Date of Encounter: 11/19/2016  Primary Cardiologist: New - consult by [facility] Problem List     Active Problems:   NSTEMI (non-ST elevated myocardial infarction) (HCC)     Subjective   No further chest pain. Echo with normal EF at 55-60% with hypokinesis of the inferolateral wall. He is for LHC this morning.   Inpatient Medications    Scheduled Meds: . aspirin  81 mg Oral Daily  . atorvastatin  80 mg Oral q1800  . metoprolol tartrate  12.5 mg Oral BID  . pantoprazole  40 mg Oral Daily  . sodium chloride flush  3 mL Intravenous Q12H   Continuous Infusions: . sodium chloride    . sodium chloride    . heparin 1,550 Units/hr (11/19/16 0007)   PRN Meds: sodium chloride, acetaminophen **OR** acetaminophen, alum & mag hydroxide-simeth, nitroGLYCERIN, ondansetron **OR** ondansetron (ZOFRAN) IV, sodium chloride flush, traMADol   Vital Signs    Vitals:   11/18/16 1130 11/18/16 1151 11/18/16 2005 11/19/16 0509  BP: 120/89 127/84 111/76 110/67  Pulse: 74 65 68 67  Resp: 17 18 16 17   Temp:  98.9 F (37.2 C) 98.9 F (37.2 C) 98.7 F (37.1 C)  TempSrc:  Oral Oral Oral  SpO2: 99% 95% 98% 98%  Weight:    257 lb 8 oz (116.8 kg)  Height:        Intake/Output Summary (Last 24 hours) at 11/19/16 0740 Last data filed at 11/19/16 0500  Gross per 24 hour  Intake          1526.29 ml  Output                0 ml  Net          1526.29 ml   Filed Weights   11/18/16 0753 11/19/16 0509  Weight: 260 lb (117.9 kg) 257 lb 8 oz (116.8 kg)    Physical Exam    GEN: Well nourished, well developed, in no acute distress.  HEENT: Grossly normal.  Neck: Supple, no JVD, carotid bruits, or masses. Cardiac: RRR, no murmurs, rubs, or gallops. No clubbing, cyanosis, edema.  Radials/DP/PT 2+ and equal bilaterally.  Respiratory:  Respirations regular and unlabored, clear to auscultation bilaterally. GI: Soft, nontender, nondistended, BS + x  4. MS: no deformity or atrophy. Skin: warm and dry, no rash. Neuro:  Strength and sensation are intact. Psych: AAOx3.  Normal affect.  Labs    CBC  Recent Labs  11/18/16 0806 11/19/16 0007  WBC 7.6 7.3  NEUTROABS 5.3  --   HGB 15.2 14.1  HCT 44.1 41.9  MCV 84.8 86.1  PLT 173 167   Basic Metabolic Panel  Recent Labs  11/18/16 0806 11/19/16 0007  NA 139 138  K 4.3 3.9  CL 101 102  CO2 29 29  GLUCOSE 134* 103*  BUN 10 12  CREATININE 1.03 1.00  CALCIUM 10.3 8.9   Liver Function Tests  Recent Labs  11/18/16 0806  AST 72*  ALT 73*  ALKPHOS 53  BILITOT 0.5  PROT 7.6  ALBUMIN 4.2    Recent Labs  11/18/16 0806  LIPASE 26   Cardiac Enzymes  Recent Labs  11/18/16 1315 11/18/16 1821 11/19/16 0007  TROPONINI 5.67* 4.85* 4.87*   BNP Invalid input(s): POCBNP D-Dimer No results for input(s): DDIMER in the last 72 hours. Hemoglobin A1C No results for input(s): HGBA1C in the last 72 hours. Fasting Lipid Panel  Recent  Labs  11/18/16 0806  CHOL 157  HDL 37*  LDLCALC 93  TRIG 213137  CHOLHDL 4.2   Thyroid Function Tests No results for input(s): TSH, T4TOTAL, T3FREE, THYROIDAB in the last 72 hours.  Invalid input(s): FREET3  Telemetry    NSR - Personally Reviewed  ECG    n/a - Personally Reviewed  Radiology    Dg Chest 2 View  Result Date: 11/18/2016 CLINICAL DATA:  Chest pain EXAM: CHEST  2 VIEW COMPARISON:  None. FINDINGS: The heart size and mediastinal contours are within normal limits. Both lungs are clear. The visualized skeletal structures are unremarkable. IMPRESSION: No active cardiopulmonary disease. Electronically Signed   By: Alcide CleverMark  Lukens M.D.   On: 11/18/2016 08:37    Cardiac Studies   TTE 11/18/16: Study Conclusions  - Left ventricle: The cavity size was at the upper limits of   normal. Wall thickness was increased in a pattern of mild LVH.   Systolic function was normal. The estimated ejection fraction was   in the range  of 55% to 60%. There is hypokinesis of the   inferolateral myocardium. Left ventricular diastolic function   parameters were normal. - Mitral valve: Mild prolapse. There was mild regurgitation. - Left atrium: The atrium was mildly dilated. - Right ventricle: The cavity size was normal. Wall thickness was   normal. Systolic function was normal.  Patient Profile     41 y.o. male without prior cardiac history, who was admitted with chest pain/NSTEMI.  Assessment & Plan    1. NSTEMI:  Pt w/o prior cardiac hx, presented to the ED with a 4 day h/o malaise and fatigue followed by chest discomfort that worsened throughout the night.  Initial troponin in ED was 4.23, with a peak of 5.67. Has been chest pain free.  He does have a FH of premature CAD (father). Plan for LHC this morning. Heparin gtt. ASA. Metoprolol. Lipitor. Risks and benefits of cardiac catheterization have been discussed with the patient including risks of bleeding, bruising, infection, kidney damage, stroke, heart attack, and death. The patient understands these risks and is willing to proceed with the procedure. All questions have been answered and concerns listened to.   2.  Lipids:  LDL 93  added high potency statin in the setting of above.   3. Hyperglycemia: Check HGB A1c.  Signed, Eula Listenyan Anslie Spadafora, PA-C [facility] Pager: [phone] 11/19/2016, 7:40 AM

## 2016-11-19 NOTE — Progress Notes (Signed)
[employer] [facility] [place] [facility]   PATIENT NAME: Victor CobbWilliam Ibarra    MR#:  [id number]  DATE OF BIRTH:  [birth year]  SUBJECTIVE:  CHIEF COMPLAINT:   Chief Complaint  Patient presents with  . Headache  . Fever  . Rash   - s/p cardiac cath and PCI today to LAD - feels better  REVIEW OF SYSTEMS:  Review of Systems  Constitutional: Negative for chills, fever and malaise/fatigue.  HENT: Negative for ear discharge, ear pain, hearing loss and nosebleeds.   Eyes: Negative for blurred vision and double vision.  Respiratory: Negative for cough, shortness of breath and wheezing.   Cardiovascular: Negative for chest pain, palpitations and leg swelling.  Gastrointestinal: Negative for abdominal pain, constipation, diarrhea, nausea and vomiting.  Genitourinary: Negative for dysuria.  Musculoskeletal: Negative for myalgias.  Neurological: Positive for headaches. Negative for dizziness, speech change, focal weakness and seizures.  Psychiatric/Behavioral: Negative for depression.    DRUG ALLERGIES:  No Known Allergies  VITALS:  Blood pressure (!) 117/93, pulse 71, temperature 98.6 F (37 C), temperature source Oral, resp. rate 17, height 6' (1.829 m), weight 116.6 kg (257 lb), SpO2 98 %.  PHYSICAL EXAMINATION:  Physical Exam  GENERAL:  41 y.o.-year-old patient lying in the bed with no acute distress.  EYES: Pupils equal, round, reactive to light and accommodation. No scleral icterus. Extraocular muscles intact.  HEENT: Head atraumatic, normocephalic. Oropharynx and nasopharynx clear.  NECK:  Supple, no jugular venous distention. No thyroid enlargement, no tenderness.  LUNGS: Normal breath sounds bilaterally, no wheezing, rales,rhonchi or crepitation. No use of accessory muscles of respiration.  CARDIOVASCULAR: S1, S2 normal. No murmurs, rubs, or gallops.  ABDOMEN: Soft, nontender, nondistended. Bowel sounds present. No organomegaly or mass.  EXTREMITIES: No pedal  edema, cyanosis, or clubbing. Right radial pressure dressing in place NEUROLOGIC: Cranial nerves II through XII are intact. Muscle strength 5/5 in all extremities. Sensation intact. Gait not checked.  PSYCHIATRIC: The patient is alert and oriented x 3.  SKIN: No obvious rash, lesion, or ulcer.    LABORATORY PANEL:   CBC  Recent Labs Lab 11/19/16 0007  WBC 7.3  HGB 14.1  HCT 41.9  PLT 167   ------------------------------------------------------------------------------------------------------------------  Chemistries   Recent Labs Lab 11/18/16 0806 11/19/16 0007  NA 139 138  K 4.3 3.9  CL 101 102  CO2 29 29  GLUCOSE 134* 103*  BUN 10 12  CREATININE 1.03 1.00  CALCIUM 10.3 8.9  AST 72*  --   ALT 73*  --   ALKPHOS 53  --   BILITOT 0.5  --    ------------------------------------------------------------------------------------------------------------------  Cardiac Enzymes  Recent Labs Lab 11/19/16 0007  TROPONINI 4.87*   ------------------------------------------------------------------------------------------------------------------  RADIOLOGY:  Dg Chest 2 View  Result Date: 11/18/2016 CLINICAL DATA:  Chest pain EXAM: CHEST  2 VIEW COMPARISON:  None. FINDINGS: The heart size and mediastinal contours are within normal limits. Both lungs are clear. The visualized skeletal structures are unremarkable. IMPRESSION: No active cardiopulmonary disease. Electronically Signed   By: Alcide CleverMark  Lukens M.D.   On: 11/18/2016 08:37    EKG:   Orders placed or performed during the hospital encounter of 11/18/16  . EKG 12-Lead  . EKG 12-Lead  . ED EKG  . ED EKG  . EKG 12-Lead  . EKG 12-Lead  . EKG 12-Lead immediately post procedure  . EKG 12-Lead  . EKG 12-Lead immediately post procedure  . EKG 12-Lead  . EKG 12-Lead  ASSESSMENT AND PLAN:   Victor Ibarra  is a 41 y.o. male with No significant past medical history comes to the hospital secondary to worsening chest  pain/burning sensation since yesterday.  #1 NSTEMI-  cardiac catheterization revealing 60% LAD lesion and possible myocardial plaque rupture causing NSTEMI - Appreciate cardiology consult. Status post drug eluting stent placed. -Started on aspirin and Brillinta. - Started on low-dose metoprolol and also statin.  #2 history of [place] spotted fever-treated with doxycycline in May 2018. - no active rash or fevers now  #3 DVT prophylaxis-Lovenox   All the records are reviewed and case discussed with Care Management/Social Workerr. Management plans discussed with the patient, family and they are in agreement.  CODE STATUS: Full Code  TOTAL TIME TAKING CARE OF THIS PATIENT: 33 minutes.   POSSIBLE D/C TOMORROW, DEPENDING ON CLINICAL CONDITION.   Enid Baas M.D on 11/19/2016 at 1:28 PM  Between 7am to 6pm - Pager - [phone]  After 6pm go to [email or web address] - password [email or web address]  [facility]  Office  [phone]  CC: Primary care physician; Patient, No Pcp Per

## 2016-11-19 NOTE — H&P (View-Only) (Signed)
Patient Name: Victor Ibarra. Date of Encounter: 11/19/2016  Primary Cardiologist: New - consult by [facility] Problem List     Active Problems:   NSTEMI (non-ST elevated myocardial infarction) (HCC)     Subjective   No further chest pain. Echo with normal EF at 55-60% with hypokinesis of the inferolateral wall. He is for LHC this morning.   Inpatient Medications    Scheduled Meds: . aspirin  81 mg Oral Daily  . atorvastatin  80 mg Oral q1800  . metoprolol tartrate  12.5 mg Oral BID  . pantoprazole  40 mg Oral Daily  . sodium chloride flush  3 mL Intravenous Q12H   Continuous Infusions: . sodium chloride    . sodium chloride    . heparin 1,550 Units/hr (11/19/16 0007)   PRN Meds: sodium chloride, acetaminophen **OR** acetaminophen, alum & mag hydroxide-simeth, nitroGLYCERIN, ondansetron **OR** ondansetron (ZOFRAN) IV, sodium chloride flush, traMADol   Vital Signs    Vitals:   11/18/16 1130 11/18/16 1151 11/18/16 2005 11/19/16 0509  BP: 120/89 127/84 111/76 110/67  Pulse: 74 65 68 67  Resp: 17 18 16 17   Temp:  98.9 F (37.2 C) 98.9 F (37.2 C) 98.7 F (37.1 C)  TempSrc:  Oral Oral Oral  SpO2: 99% 95% 98% 98%  Weight:    257 lb 8 oz (116.8 kg)  Height:        Intake/Output Summary (Last 24 hours) at 11/19/16 0740 Last data filed at 11/19/16 0500  Gross per 24 hour  Intake          1526.29 ml  Output                0 ml  Net          1526.29 ml   Filed Weights   11/18/16 0753 11/19/16 0509  Weight: 260 lb (117.9 kg) 257 lb 8 oz (116.8 kg)    Physical Exam    GEN: Well nourished, well developed, in no acute distress.  HEENT: Grossly normal.  Neck: Supple, no JVD, carotid bruits, or masses. Cardiac: RRR, no murmurs, rubs, or gallops. No clubbing, cyanosis, edema.  Radials/DP/PT 2+ and equal bilaterally.  Respiratory:  Respirations regular and unlabored, clear to auscultation bilaterally. GI: Soft, nontender, nondistended, BS + x  4. MS: no deformity or atrophy. Skin: warm and dry, no rash. Neuro:  Strength and sensation are intact. Psych: AAOx3.  Normal affect.  Labs    CBC  Recent Labs  11/18/16 0806 11/19/16 0007  WBC 7.6 7.3  NEUTROABS 5.3  --   HGB 15.2 14.1  HCT 44.1 41.9  MCV 84.8 86.1  PLT 173 167   Basic Metabolic Panel  Recent Labs  11/18/16 0806 11/19/16 0007  NA 139 138  K 4.3 3.9  CL 101 102  CO2 29 29  GLUCOSE 134* 103*  BUN 10 12  CREATININE 1.03 1.00  CALCIUM 10.3 8.9   Liver Function Tests  Recent Labs  11/18/16 0806  AST 72*  ALT 73*  ALKPHOS 53  BILITOT 0.5  PROT 7.6  ALBUMIN 4.2    Recent Labs  11/18/16 0806  LIPASE 26   Cardiac Enzymes  Recent Labs  11/18/16 1315 11/18/16 1821 11/19/16 0007  TROPONINI 5.67* 4.85* 4.87*   BNP Invalid input(s): POCBNP D-Dimer No results for input(s): DDIMER in the last 72 hours. Hemoglobin A1C No results for input(s): HGBA1C in the last 72 hours. Fasting Lipid Panel  Recent  Labs  11/18/16 0806  CHOL 157  HDL 37*  LDLCALC 93  TRIG 213137  CHOLHDL 4.2   Thyroid Function Tests No results for input(s): TSH, T4TOTAL, T3FREE, THYROIDAB in the last 72 hours.  Invalid input(s): FREET3  Telemetry    NSR - Personally Reviewed  ECG    n/a - Personally Reviewed  Radiology    Dg Chest 2 View  Result Date: 11/18/2016 CLINICAL DATA:  Chest pain EXAM: CHEST  2 VIEW COMPARISON:  None. FINDINGS: The heart size and mediastinal contours are within normal limits. Both lungs are clear. The visualized skeletal structures are unremarkable. IMPRESSION: No active cardiopulmonary disease. Electronically Signed   By: Alcide CleverMark  Lukens M.D.   On: 11/18/2016 08:37    Cardiac Studies   TTE 11/18/16: Study Conclusions  - Left ventricle: The cavity size was at the upper limits of   normal. Wall thickness was increased in a pattern of mild LVH.   Systolic function was normal. The estimated ejection fraction was   in the range  of 55% to 60%. There is hypokinesis of the   inferolateral myocardium. Left ventricular diastolic function   parameters were normal. - Mitral valve: Mild prolapse. There was mild regurgitation. - Left atrium: The atrium was mildly dilated. - Right ventricle: The cavity size was normal. Wall thickness was   normal. Systolic function was normal.  Patient Profile     41 y.o. male without prior cardiac history, who was admitted with chest pain/NSTEMI.  Assessment & Plan    1. NSTEMI:  Pt w/o prior cardiac hx, presented to the ED with a 4 day h/o malaise and fatigue followed by chest discomfort that worsened throughout the night.  Initial troponin in ED was 4.23, with a peak of 5.67. Has been chest pain free.  He does have a FH of premature CAD (father). Plan for LHC this morning. Heparin gtt. ASA. Metoprolol. Lipitor. Risks and benefits of cardiac catheterization have been discussed with the patient including risks of bleeding, bruising, infection, kidney damage, stroke, heart attack, and death. The patient understands these risks and is willing to proceed with the procedure. All questions have been answered and concerns listened to.   2.  Lipids:  LDL 93  added high potency statin in the setting of above.   3. Hyperglycemia: Check HGB A1c.  Signed, Eula Listenyan Dunn, PA-C [facility] Pager: [phone] 11/19/2016, 7:40 AM

## 2016-11-19 NOTE — Care Management (Signed)
CM screen due to diagnosis. Ruled in for nstemi.  Cardiac cath resulted in pci.  Patient to discharge on Brilinta.  Patient verbally confirmed pharmacy coverage with insurance.  Provided with copay assist coupon.

## 2016-11-20 ENCOUNTER — Telehealth: Payer: Self-pay | Admitting: Internal Medicine

## 2016-11-20 ENCOUNTER — Telehealth: Payer: Self-pay | Admitting: *Deleted

## 2016-11-20 ENCOUNTER — Encounter: Payer: Self-pay | Admitting: Internal Medicine

## 2016-11-20 DIAGNOSIS — I208 Other forms of angina pectoris: Secondary | ICD-10-CM

## 2016-11-20 LAB — HEMOGLOBIN A1C
HEMOGLOBIN A1C: 5.2 % (ref 4.8–5.6)
Mean Plasma Glucose: 103 mg/dL

## 2016-11-20 MED ORDER — METOPROLOL SUCCINATE ER 25 MG PO TB24: 25.0000 mg | ORAL_TABLET | Freq: Every day | ORAL | 2 refills | Status: DC

## 2016-11-20 MED ORDER — ASPIRIN 81 MG PO CHEW: 81.0000 mg | CHEWABLE_TABLET | Freq: Every day | ORAL | 2 refills | Status: AC

## 2016-11-20 MED ORDER — ATORVASTATIN CALCIUM 80 MG PO TABS: 80.0000 mg | ORAL_TABLET | Freq: Every day | ORAL | 2 refills | Status: DC

## 2016-11-20 MED ORDER — NITROGLYCERIN 0.4 MG SL SUBL: 0.4000 mg | SUBLINGUAL_TABLET | SUBLINGUAL | 2 refills | Status: DC | PRN

## 2016-11-20 MED ORDER — TICAGRELOR 90 MG PO TABS: 90.0000 mg | ORAL_TABLET | Freq: Two times a day (BID) | ORAL | 2 refills | Status: DC

## 2016-11-20 NOTE — Telephone Encounter (Signed)
[facility] [address]

## 2016-11-20 NOTE — Telephone Encounter (Signed)
Spoke with pharmacy and they state the card only covers $200 max and that is why it will cost them $146. Medication cost is $422, his insurance covers $346, the discount card covers $200, leaving $146 for patient to pay.

## 2016-11-20 NOTE — Discharge Summary (Signed)
[employer] - [place] at [facility]   PATIENT NAME: Victor Ibarra    MR#:  [id number]  DATE OF BIRTH:  Sep 16, 1975  DATE OF ADMISSION:  11/18/2016   ADMITTING PHYSICIAN: Enid Baas, MD  DATE OF DISCHARGE: 11/20/2016  9:40 AM  PRIMARY CARE PHYSICIAN: Patient, No Pcp Per   ADMISSION DIAGNOSIS:   NSTEMI (non-ST elevated myocardial infarction) (HCC) [I21.4]  DISCHARGE DIAGNOSIS:   Active Problems:   NSTEMI (non-ST elevated myocardial infarction) (HCC)   SECONDARY DIAGNOSIS:   Past Medical History:  Diagnosis Date  . [facility] spotted fever    a. Dx 09/2016 - s/p 2 wks of doxycycline.  . Tick bite     HOSPITAL COURSE:   Xai Frerking a 41 y.o. malewith No significant past medical history comes to the hospital secondary to worsening chest pain/burning sensation since yesterday.  #1 NSTEMI-  cardiac catheterization revealing 60% LAD lesion and possible myocardial plaque rupture causing NSTEMI - Appreciate cardiology consult. Status post drug eluting stent placed. -Started on aspirin and Brillinta. - Started on low-dose metoprolol and also statin.  #2 history of [place] spotted fever-treated with doxycycline in May 2018. - no active rash or fevers now  Stable and very anxious to be discharged today.  DISCHARGE CONDITIONS:   Stable  CONSULTS OBTAINED:   Treatment Team:  Iran Ouch, MD  DRUG ALLERGIES:   No Known Allergies DISCHARGE MEDICATIONS:   Allergies as of 11/20/2016   No Known Allergies     Medication List    TAKE these medications   aspirin 81 MG chewable tablet Chew 1 tablet (81 mg total) by mouth daily.   atorvastatin 80 MG tablet Commonly known as:  LIPITOR Take 1 tablet (80 mg total) by mouth daily at 6 PM.   metoprolol succinate 25 MG 24 hr tablet Commonly known as:  TOPROL XL Take 1 tablet (25 mg total) by mouth daily.   nitroGLYCERIN 0.4 MG SL tablet Commonly known as:   NITROSTAT Place 1 tablet (0.4 mg total) under the tongue every 5 (five) minutes as needed for chest pain.   ticagrelor 90 MG Tabs tablet Commonly known as:  BRILINTA Take 1 tablet (90 mg total) by mouth 2 (two) times daily.        DISCHARGE INSTRUCTIONS:   1. PCP f/u in 1-2 weeks 2. Cardiology f/u in 2 weeks  DIET:   Cardiac diet  ACTIVITY:   Activity as tolerated  OXYGEN:   Home Oxygen: No.  Oxygen Delivery: room air  DISCHARGE LOCATION:   home   If you experience worsening of your admission symptoms, develop shortness of breath, life threatening emergency, suicidal or homicidal thoughts you must seek medical attention immediately by calling 911 or calling your MD immediately  if symptoms less severe.  You Must read complete instructions/literature along with all the possible adverse reactions/side effects for all the Medicines you take and that have been prescribed to you. Take any new Medicines after you have completely understood and accpet all the possible adverse reactions/side effects.   Please note  You were cared for by a hospitalist during your hospital stay. If you have any questions about your discharge medications or the care you received while you were in the hospital after you are discharged, you can call the unit and asked to speak with the hospitalist on call if the hospitalist that took care of you is not available. Once you are discharged, your primary care physician will handle  any further medical issues. Please note that NO REFILLS for any discharge medications will be authorized once you are discharged, as it is imperative that you return to your primary care physician (or establish a relationship with a primary care physician if you do not have one) for your aftercare needs so that they can reassess your need for medications and monitor your lab values.    On the day of Discharge:  VITAL SIGNS:   Blood pressure (!) 122/94, pulse 78, temperature  98.7 F (37.1 C), temperature source Oral, resp. rate 18, height 6' (1.829 m), weight 116.4 kg (256 lb 9.6 oz), SpO2 94 %.  PHYSICAL EXAMINATION:    GENERAL:  41 y.o.-year-old patient lying in the bed with no acute distress.  EYES: Pupils equal, round, reactive to light and accommodation. No scleral icterus. Extraocular muscles intact.  HEENT: Head atraumatic, normocephalic. Oropharynx and nasopharynx clear.  NECK:  Supple, no jugular venous distention. No thyroid enlargement, no tenderness.  LUNGS: Normal breath sounds bilaterally, no wheezing, rales,rhonchi or crepitation. No use of accessory muscles of respiration.  CARDIOVASCULAR: S1, S2 normal. No murmurs, rubs, or gallops.  ABDOMEN: Soft, nontender, nondistended. Bowel sounds present. No organomegaly or mass.  EXTREMITIES: No pedal edema, cyanosis, or clubbing. Right radial pressure dressing in place NEUROLOGIC: Cranial nerves II through XII are intact. Muscle strength 5/5 in all extremities. Sensation intact. Gait not checked.  PSYCHIATRIC: The patient is alert and oriented x 3.  SKIN: No obvious rash, lesion, or ulcer.    DATA REVIEW:   CBC  Recent Labs Lab 11/19/16 0007  WBC 7.3  HGB 14.1  HCT 41.9  PLT 167    Chemistries   Recent Labs Lab 11/18/16 0806 11/19/16 0007  NA 139 138  K 4.3 3.9  CL 101 102  CO2 29 29  GLUCOSE 134* 103*  BUN 10 12  CREATININE 1.03 1.00  CALCIUM 10.3 8.9  AST 72*  --   ALT 73*  --   ALKPHOS 53  --   BILITOT 0.5  --      Microbiology Results  No results found for this or any previous visit.  RADIOLOGY:  No results found.   Management plans discussed with the patient, family and they are in agreement.  CODE STATUS:     Code Status Orders        Start     Ordered   11/18/16 1214  Full code  Continuous     11/18/16 1213    Code Status History    Date Active Date Inactive Code Status Order ID Comments User Context   This patient has a current code status but no  historical code status.    Advance Directive Documentation     Most Recent Value  Type of Advance Directive  Healthcare Power of Attorney, Living will  Pre-existing out of facility DNR order (yellow form or pink MOST form)  -  "MOST" Form in Place?  -      TOTAL TIME TAKING CARE OF THIS PATIENT: 37 minutes.    Enid BaasKALISETTI,Karrigan Messamore M.D on 11/20/2016 at 9:47 AM  Between 7am to 6pm - Pager - [phone]  After 6pm go to [email or web address] - Social research officer, governmentpassword EPAS [facility]  [facility] [place] [facility]  Office  [phone]  CC: Primary care physician; Patient, No Pcp Per   Note: This dictation was prepared with Dragon dictation along with smaller phrase technology. Any transcriptional errors that result from this process are unintentional.

## 2016-11-20 NOTE — Telephone Encounter (Signed)
Left voicemail message to call back  

## 2016-11-20 NOTE — Care Management (Signed)
Received call from patient's wife.  Acknowledges again that has Charity fundraiser.  Informed that the coupon "only took 200 dollars off the cost.  We had to pay 146 for the medication.  Discussed that cost may be higher if has not met deductible for the plan.  CM spoke with insurance company and found that this is the case. Patient has 13,300 dollar deductible of which 350 dollars has been met- which does not include this hospital stay.  discussed patient assistance applications but Ms Eckert feel patient would not meet the criteria but will investigate.  They will continue to pay for the medication and not stop it.  Discussed speaking to cardiology if this becomes cost prohibitive.

## 2016-11-20 NOTE — Telephone Encounter (Signed)
Patient currently admitted

## 2016-11-20 NOTE — Plan of Care (Signed)
Problem: Pain Managment: Goal: General experience of comfort will improve Outcome: Completed/Met Date Met: 11/20/16 No complaints of pain this shift  Problem: Tissue Perfusion: Goal: Risk factors for ineffective tissue perfusion will decrease Outcome: Completed/Met Date Met: 11/20/16 Started on brilinta  Problem: Activity: Goal: Risk for activity intolerance will decrease Outcome: Completed/Met Date Met: 11/20/16 Up independently walked around nursing station tolerated well  Problem: Cardiovascular: Goal: Vascular access site(s) Level 0-1 will be maintained Outcome: Completed/Met Date Met: 11/20/16 Right radial cath site, level 0 no bleeding/bruising

## 2016-11-20 NOTE — Progress Notes (Signed)
Pt is d/ced home and eager to leave.  Experiencing no pain at R wrist where they entered for stent placement.  No SOB or chest pain.  Patient has seen cardiologist and educated on medicines.  Will review f/u appts and d/c instructions.  Emphasized that patient is to take it easy for 2 wks.  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  Patient will go home with wife.

## 2016-11-20 NOTE — Progress Notes (Signed)
Patient Name: Victor Ibarra. Date of Encounter: 11/20/2016  Primary Cardiologist: New to [facility] - consult by [facility] Problem List     Active Problems:   NSTEMI (non-ST elevated myocardial infarction) (HCC)     Subjective   No further chest pain. Successful PCI/DES to distal LAD. Tolerating DAPT without issues. Has ambulated without issues.   Inpatient Medications    Scheduled Meds: . aspirin  81 mg Oral Daily  . atorvastatin  80 mg Oral q1800  . metoprolol tartrate  12.5 mg Oral BID  . pantoprazole  40 mg Oral Daily  . sodium chloride flush  3 mL Intravenous Q12H  . ticagrelor  90 mg Oral BID   Continuous Infusions: . sodium chloride     PRN Meds: sodium chloride, acetaminophen **OR** acetaminophen, alum & mag hydroxide-simeth, nitroGLYCERIN, ondansetron **OR** ondansetron (ZOFRAN) IV, sodium chloride flush, traMADol   Vital Signs    Vitals:   11/19/16 1600 11/19/16 1647 11/19/16 2021 11/20/16 0541  BP: 124/86 121/83 118/79 121/78  Pulse: 76 74 66 71  Resp: 17 18 16 18   Temp:   98.2 F (36.8 C) 98.7 F (37.1 C)  TempSrc:   Oral Oral  SpO2: 98% 97% 98% 94%  Weight:    256 lb 9.6 oz (116.4 kg)  Height:        Intake/Output Summary (Last 24 hours) at 11/20/16 0815 Last data filed at 11/20/16 0541  Gross per 24 hour  Intake          1669.37 ml  Output             2100 ml  Net          -430.63 ml   Filed Weights   11/19/16 0509 11/19/16 0900 11/20/16 0541  Weight: 257 lb 8 oz (116.8 kg) 257 lb (116.6 kg) 256 lb 9.6 oz (116.4 kg)    Physical Exam    GEN: Well nourished, well developed, in no acute distress.  HEENT: Grossly normal.  Neck: Supple, no JVD, carotid bruits, or masses. Cardiac: RRR, no murmurs, rubs, or gallops. No clubbing, cyanosis, edema.  Radials/DP/PT 2+ and equal bilaterally. Right radial cath site without bleeding, bruising, swelling, erythema, or TTP. Radial pulse 2+.  Respiratory:  Respirations regular and  unlabored, clear to auscultation bilaterally. GI: Soft, nontender, nondistended, BS + x 4. MS: no deformity or atrophy. Skin: warm and dry, no rash. Neuro:  Strength and sensation are intact. Psych: AAOx3.  Normal affect.  Labs    CBC  Recent Labs  11/18/16 0806 11/19/16 0007  WBC 7.6 7.3  NEUTROABS 5.3  --   HGB 15.2 14.1  HCT 44.1 41.9  MCV 84.8 86.1  PLT 173 167   Basic Metabolic Panel  Recent Labs  11/18/16 0806 11/19/16 0007  NA 139 138  K 4.3 3.9  CL 101 102  CO2 29 29  GLUCOSE 134* 103*  BUN 10 12  CREATININE 1.03 1.00  CALCIUM 10.3 8.9   Liver Function Tests  Recent Labs  11/18/16 0806  AST 72*  ALT 73*  ALKPHOS 53  BILITOT 0.5  PROT 7.6  ALBUMIN 4.2    Recent Labs  11/18/16 0806  LIPASE 26   Cardiac Enzymes  Recent Labs  11/18/16 1315 11/18/16 1821 11/19/16 0007  TROPONINI 5.67* 4.85* 4.87*   BNP Invalid input(s): POCBNP D-Dimer No results for input(s): DDIMER in the last 72 hours. Hemoglobin A1C  Recent Labs  11/19/16 0007  HGBA1C 5.2   Fasting Lipid Panel  Recent Labs  11/18/16 0806  CHOL 157  HDL 37*  LDLCALC 93  TRIG [id number]  CHOLHDL 4.2   Thyroid Function Tests No results for input(s): TSH, T4TOTAL, T3FREE, THYROIDAB in the last 72 hours.  Invalid input(s): FREET3  Telemetry    NSR, 70s bpm - Personally Reviewed  ECG    n/a - Personally Reviewed  Radiology    Dg Chest 2 View  Result Date: 11/18/2016 IMPRESSION: No active cardiopulmonary disease. Electronically Signed   By: Alcide CleverMark  Lukens M.D.   On: 11/18/2016 08:37    Cardiac Studies   LHC 11/19/2016: Coronary Findings   Dominance: Right  Left Main  Vessel is large. Vessel is angiographically normal.  Left Anterior Descending  There is mid myocardial bridging present.  Dist LAD lesion, 60% stenosed. Pressure wire/FFR was performed on the lesion. FFR: 0.81.  Angioplasty: A STENT XIENCE ALPINE RX [id number] drug eluting stent was successfully  placed. Stent strut is well apposed. The pre-interventional distal flow is normal (TIMI 3). The post-interventional distal flow is normal (TIMI 3). See technique section for details of stent implantation.  There is no residual stenosis post intervention.  First Diagonal Branch  Vessel is moderate in size.  Second Diagonal Branch  Vessel is moderate in size.  Left Circumflex  Vessel is large. Vessel is angiographically normal.  First Obtuse Marginal Branch  Vessel is moderate in size.  Second Obtuse Marginal Branch  Vessel is moderate in size.  Third Obtuse Marginal Branch  Vessel is small in size.  Right Coronary Artery  Vessel is large.  Right Posterior Descending Artery  Vessel is small in size.  Right Posterior Atrioventricular Branch  Vessel is small in size.  Wall Motion              Left Heart   Left Ventricle The left ventricular size is normal. The left ventricular systolic function is normal. LV end diastolic pressure is mildly elevated. The left ventricular ejection fraction is 55-65% by visual estimate. No regional wall motion abnormalities. There is no evidence of mitral regurgitation.    Coronary Diagrams   Diagnostic Diagram       Post-Intervention Diagram         TTE 11/18/2016: Study Conclusions  - Left ventricle: The cavity size was at the upper limits of   normal. Wall thickness was increased in a pattern of mild LVH.   Systolic function was normal. The estimated ejection fraction was   in the range of 55% to 60%. There is hypokinesis of the   inferolateral myocardium. Left ventricular diastolic function   parameters were normal. - Mitral valve: Mild prolapse. There was mild regurgitation. - Left atrium: The atrium was mildly dilated. - Right ventricle: The cavity size was normal. Wall thickness was   normal. Systolic function was normal.  Patient Profile     41 y.o. male without prior cardiac history, who was admitted with chest  pain/NSTEMI.  Assessment & Plan    1. NSTEMI: Pt w/o prior cardiac hx, presented to the ED with a 4 day h/o malaise and fatigue followed by chest discomfort that worsened throughout the night. Initial troponin in ED was 4.23, with a peak of 5.67. Has been chest pain free. He does have a FH of premature CAD (father). He underwent LHC on 11/19/16 that showed a 60% stenosis of the distal LAD with FFR of 0.81. Lesion was preceded by a myocardial bridge.  He  underwent successful PCI/DES. Continue DAPT with ASA 81 mg daily and Brilinta 90 mg bid.  The importance of DAPT was discussed in detail with the patient and his wife. Titrate metoprolol to 25 mg daily to achieve a target heart rate of ~ 60 bpm given myocardial bridge. Lipitor. Cardiac rehab.  Post cardiac cath instructions covered in detail.    2. Lipids: LDL 93 added high potency statin in the setting of above.   3. Hyperglycemia: HGB A1c 5.2%.  Signed, Eula Listen, PA-C [facility] Pager: [phone] 11/20/2016, 8:15 AM   Attending Note Patient seen and examined, agree with detailed note above,  Patient presentation and plan discussed on rounds.   Reports doing well today after stent placement yesterday to his mid LAD No chest burning on ambulation Long discussion concerning cardiac catheterization findings  On physical exam lungs clear to auscultation bilaterally, heart sounds regular no murmurs appreciated, abdomen soft nontender, no significant lower extremity edema Right radial access site with no significant ecchymosis  Lab work reviewed showing stable BMP and CBC Troponin trending down 4.87  ---CAD, stent to his LAD Long discussion concerning his medications He will take aspirin with Brilinta if affordable We later found out that he has $13,000 pharmacy deductible At a later date will likely need to be changed to Plavix Free month and samples provided  --- Hyperlipidemia He will stay on the statin- Total  cholesterol 157, LDL 93   Total encounter time more than 25 minutes  Greater than 50% was spent in counseling and coordination of care with the patient   Greater than 50% was spent in counseling and coordination of care with patient Total encounter time 25 minutes or more   Signed: Dossie Arbour  M.D., Ph.D. [facility]

## 2016-11-20 NOTE — Telephone Encounter (Signed)
Reviewed with patients wife that coupon card only covers maximum of $200 and that is the reason for the increased cost. She states that they went ahead and paid that for today and will speak with physician on follow up regarding options and costs. Let her know that I would place samples up front for them to pick up when they are over this way and coupon card with specifics on coverage. She was so appreciative for the call and had no further questions at this time.   Medication Samples have been provided to the patient.  Drug name: Brilinta       Strength: 90 mg        Qty: 4 bottles  LOT: [id number] (3) [id number] (1)  Exp.Date: 8/20 (3) and 01/21 (1)  Placed up front for pick up.

## 2016-11-20 NOTE — Telephone Encounter (Signed)
Pt spouse states pt was D[facility] this [facility] and was given a Brilinta rx. Pt states she has a $5 card to get this rx, but at the pharmacy it is making them pay $140. Please call regarding this.

## 2016-11-20 NOTE — Telephone Encounter (Signed)
Patient [facility].  Patient understands to follow up with provider Ward Givenshris Berge NP on 12/04/16 at 10:00 am at [facility]. Patient understands discharge instructions? Yes Patient understands medications and regiment? Yes Patient understands to bring all medications to this visit? Yes  Instructed her to call back if she should have any questions and she verbalized understanding with no further questions at this time.

## 2016-11-20 NOTE — Telephone Encounter (Signed)
-----   Message from Coralee RudSabrina F Gilley sent at 11/20/2016  8:48 AM EDT ----- Regarding: tcm/ph 8/1 10:00 Nicolasa Duckinghristopher Berge, NP

## 2016-12-04 ENCOUNTER — Encounter: Payer: Self-pay | Admitting: Nurse Practitioner

## 2016-12-04 ENCOUNTER — Ambulatory Visit (INDEPENDENT_AMBULATORY_CARE_PROVIDER_SITE_OTHER): Payer: BLUE CROSS/BLUE SHIELD | Admitting: Nurse Practitioner

## 2016-12-04 VITALS — BP 110/88 | Ht 73.0 in | Wt 259.2 lb

## 2016-12-04 DIAGNOSIS — I214 Non-ST elevation (NSTEMI) myocardial infarction: Secondary | ICD-10-CM | POA: Diagnosis not present

## 2016-12-04 DIAGNOSIS — I2510 Atherosclerotic heart disease of native coronary artery without angina pectoris: Secondary | ICD-10-CM

## 2016-12-04 DIAGNOSIS — E785 Hyperlipidemia, unspecified: Secondary | ICD-10-CM | POA: Diagnosis not present

## 2016-12-04 NOTE — Patient Instructions (Addendum)
Labwork: Your physician recommends that you return for lab work in: 3 weeks around December 25, 2016. Go to [facility] the hospital and check in at the first desk. Take lab slips with you and no appointment is needed. Make sure not to eat or drink anything after midnight before. May take a small sip of water with your medications that morning.    Follow-Up: Your physician recommends that you schedule a follow-up appointment in: 3 months with Dr. Kirke CorinArida.    It was a pleasure seeing you today here in the office. Please do not hesitate to give us a call back if you have any further questions. [phone]  Smith Corner CellarPamela A. RN, BSN     Primary Care physician office: [facility] [id number] [place] [id number]

## 2016-12-04 NOTE — Progress Notes (Signed)
Office Visit    Patient Name: Victor BillWilliam Robert Koegel Jr. Date of Encounter: 12/04/2016  Primary Care Provider:  Patient, No Pcp Per Primary Cardiologist:  M. Kirke CorinArida, MD   Chief Complaint    41 year old male status post recent non-STEMI and LAD stenting, who presents for follow-up.  Past Medical History    Past Medical History:  Diagnosis Date  . CAD (coronary artery disease)    a. 11/2016 NSTEMI/PCI: LAD 60p w/ bridge.  FFR 0.81-->PCI performed 2/2 ACS/presumed plaque rupture (2.75x18 Xience Alpine DES). OTW nl cors, EF 55-65%.  . MVP (mitral valve prolapse)    a. 11/2014 Echo: EF 55-60%, mild LVh, inferolateral HK, mild MVP, mild MR, nl RV fxn.  . Obesity   [facility]    a. Dx 09/2016 - s/p 2 wks of doxycycline.  . Tick bite    Past Surgical History:  Procedure Laterality Date  . CORONARY STENT INTERVENTION N/A 11/19/2016   Procedure: Coronary Stent Intervention;  Surgeon: Yvonne KendallEnd, Elysabeth Aust, MD;  Location: [facility] INVASIVE CV LAB;  Service: Cardiovascular;  Laterality: N/A;  . INTRAVASCULAR PRESSURE WIRE/FFR STUDY N/A 11/19/2016   Procedure: Intravascular Pressure Wire/FFR Study;  Surgeon: Yvonne KendallEnd, Taliah Porche, MD;  Location: [facility] CV [facility];  Service: Cardiovascular;  Laterality: N/A;  . LEFT HEART CATH AND CORONARY ANGIOGRAPHY N/A 11/19/2016   Procedure: Left Heart Cath and Coronary Angiography;  Surgeon: Yvonne KendallEnd, Providence Stivers, MD;  Location: [facility] INVASIVE CV LAB;  Service: Cardiovascular;  Laterality: N/A;    Allergies  No Known Allergies  History of Present Illness    41 year old male with a family history of premature CAD, who was recently [facility] July 16 with chest burning, malaise, and fatigue. He ruled in for non-STEMI. Catheterization revealed 60% proximal LAD stenosis with a myocardial bridge. Fractional flow reserve was 0.81. Given symptoms and acute coronary syndrome, decision was made to treat the LAD and this was successfully  treated with drug-eluting stent. He has been managed with aspirin, Brilinta, beta blocker, and statin therapy. Since his discharge, he has not had any chest pain or dyspnea. 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 He has not had any limitations in activities. He does occasionally note a brief spells of feeling as though he needs to take a deep breath but has not had any significant shortness of breath. He is otherwise tolerating medications well. He has questions today about how to best go about weight loss. He denies palpitations, PND, orthopnea, dizziness, syncope, edema, or early satiety.  Home Medications    Prior to Admission medications   Medication Sig Start Date End Date Taking? Authorizing Provider  aspirin 81 MG chewable tablet Chew 1 tablet (81 mg total) by mouth daily. 11/20/16  Yes Enid BaasKalisetti, Radhika, MD  atorvastatin (LIPITOR) 80 MG tablet Take 1 tablet (80 mg total) by mouth daily at 6 PM. 11/20/16  Yes Enid BaasKalisetti, Radhika, MD  metoprolol succinate (TOPROL XL) 25 MG 24 hr tablet Take 1 tablet (25 mg total) by mouth daily. 11/20/16 11/20/17 Yes Enid BaasKalisetti, Radhika, MD  nitroGLYCERIN (NITROSTAT) 0.4 MG SL tablet Place 1 tablet (0.4 mg total) under the tongue every 5 (five) minutes as needed for chest pain. 11/20/16  Yes Enid BaasKalisetti, Radhika, MD  ticagrelor (BRILINTA) 90 MG TABS tablet Take 1 tablet (90 mg total) by mouth 2 (two) times daily. 11/20/16  Yes Enid BaasKalisetti, Radhika, MD    Review of Systems  He denies chest pain, palpitations, dyspnea, pnd, orthopnea, n, v, dizziness, syncope, edema, weight gain, or early satiety.  All other systems reviewed and are otherwise negative except as noted above.  Physical Exam    VS:  BP 110/88 (BP Location: Left Arm, Patient Position: Sitting, Cuff Size: Large)   Ht 6\' 1"  (1.854 m)   Wt 259 lb 4 oz (117.6 kg)   BMI 34.20 kg/m  , BMI Body mass index is 34.2 kg/m. GEN: Well nourished, well developed, in no  acute distress.  HEENT: normal.  Neck: Supple, no JVD, carotid bruits, or masses. Cardiac: RRR, no murmurs, rubs, or gallops. No clubbing, cyanosis, edema.  Radials/DP/PT 2+ and equal bilaterally.  Respiratory:  Respirations regular and unlabored, clear to auscultation bilaterally. GI: Soft, nontender, nondistended, BS + x 4. MS: no deformity or atrophy. Skin: warm and dry, no rash. Neuro:  Strength and sensation are intact. Psych: Normal affect.  Accessory Clinical Findings    ECG - Regular sinus rhythm, 70, inferolateral T changes similar to hospital ECGs.  Assessment & Plan    1.  Non-STEMI, subsequent episode of care/coronary artery disease: Patient was recently admitted with chest pain and elevated troponin. Catheterization revealed 60% plaque in the LAD and this was successfully treated with a drug-eluting stent due to concern of her plaque rupture in the setting of ACS. Patient has been doing well without recurrence of chest pain.  He remains on aspirin, statin, beta blocker, and Brilinta. He has occasionally noted the sensation of needing to take a deep breath but has not necessarily had shortness of breath. I suspect his symptoms may be related to Brilinta, which she says are improving. He would like to continue taking Brilinta. His first month Brilinta was quite expensive and the setting of a high deductible health care plan though he expects that cost will come down at refill once his hospitalization his billed. He will let us know if cost becomes an issue we can help provide him with samples or potentially switch him over to Plavix. He is interested in cardiac rehabilitation but doesn't think that his work schedule will allow for now. He is a tobacco farmer. Perhaps the fall or winter, he will look into enrolling, when his work schedule will allow.  2. Hyperlipidemia: LDL was 93 on July 16. LFTs were normal. Plan follow-up lipids and LFTs in about 3 more weeks, given high potency  statin therapy.  3. Morbid obesity: Patient is 259 pounds. He is active as a farmer throughout the day but does not routinely exercise. We had a long discussion today about caloric intake cutting portion sizes. He likely gets most of his calories after work, often later in the evening and eats quite a bit of red meat. He will pay more attention to his caloric intake and will consider using a calorie counting application on his phone. He will also try and get at least 30 minutes of exercise each day.  4. Disposition: Follow-up with Dr. Kirke CorinArida in 3 months or sooner if necessary. Follow-up lipids and LFTs in about 3 more weeks.   Nicolasa Duckinghristopher Sandie Swayze, NP 12/04/2016, 10:50 AM

## 2016-12-24 ENCOUNTER — Other Ambulatory Visit
Admission: RE | Admit: 2016-12-24 | Discharge: 2016-12-24 | Disposition: A | Discharge status: Home / Self Care | Payer: BLUE CROSS/BLUE SHIELD | Source: Ambulatory Visit | Attending: Nurse Practitioner | Admitting: Nurse Practitioner

## 2016-12-24 DIAGNOSIS — I214 Non-ST elevation (NSTEMI) myocardial infarction: Secondary | ICD-10-CM | POA: Diagnosis present

## 2016-12-24 LAB — LIPID PANEL
CHOL/HDL RATIO: 2.2 ratio
CHOLESTEROL: 79 mg/dL (ref 0–200)
HDL: 36 mg/dL — ABNORMAL LOW (ref >40)
LDL Cholesterol: 32 mg/dL (ref 0–99)
TRIGLYCERIDES: 56 mg/dL (ref <150)
VLDL: 11 mg/dL (ref 0–40)

## 2016-12-24 LAB — HEPATIC FUNCTION PANEL
ALBUMIN: 4.4 g/dL (ref 3.5–5.0)
ALT: 31 U/L (ref 17–63)
AST: 24 U/L (ref 15–41)
Alkaline Phosphatase: 38 U/L (ref 38–126)
BILIRUBIN TOTAL: 0.8 mg/dL (ref 0.3–1.2)
Bilirubin, Direct: 0.1 mg/dL (ref 0.1–0.5)
Indirect Bilirubin: 0.7 mg/dL (ref 0.3–0.9)
Total Protein: 7.2 g/dL (ref 6.5–8.1)

## 2017-02-04 ENCOUNTER — Encounter: Payer: Self-pay | Admitting: Nurse Practitioner

## 2017-02-04 NOTE — Patient Instructions (Signed)
Pt has not picked up Brilinta samples. Returned to [employer].  Brilinta  Lot # [id number] exp: 12/2018 (1)          [id number]  Exp:05/2019 (1)           [id number]   Exp: 12/2018 (1)

## 2017-02-05 ENCOUNTER — Telehealth: Payer: Self-pay | Admitting: Cardiovascular Disease

## 2017-02-05 MED ORDER — TICAGRELOR 90 MG PO TABS: 90.0000 mg | ORAL_TABLET | Freq: Two times a day (BID) | ORAL | 0 refills | Status: DC

## 2017-02-05 MED ORDER — ATORVASTATIN CALCIUM 80 MG PO TABS: 80.0000 mg | ORAL_TABLET | Freq: Every day | ORAL | 0 refills | Status: DC

## 2017-02-05 MED ORDER — METOPROLOL SUCCINATE ER 25 MG PO TB24: 25.0000 mg | ORAL_TABLET | Freq: Every day | ORAL | 0 refills | Status: DC

## 2017-02-05 NOTE — Telephone Encounter (Signed)
Follow up    Would also like to have refills on    *STAT* If patient is at the pharmacy, call can be transferred to refill team.   1. Which medications need to be refilled? (please list name of each medication and dose if known) ticagrelor (BRILINTA) 90 MG TABS tablet  metoprolol succinate (TOPROL XL) 25 MG 24 hr tablet   atorvastatin (LIPITOR) 80 MG tablet 2. Which pharmacy/location (including street and city if local pharmacy) is medication to be sent to? [facility] [address] [place]   3. Do they need a 30 day or 90 day supply? 90 if possible

## 2017-02-05 NOTE — Telephone Encounter (Signed)
New Message      *STAT* If patient is at the pharmacy, call can be transferred to refill team.   1. Which medications need to be refilled? (please list name of each medication and dose if known)  Nitro  - patient dropped bottle and broke it , needs a new prescription  2. Which pharmacy/location (including street and city if local pharmacy) is medication to be sent to? [facility] [address] [place]   3. Do they need a 30 day or 90 day supply?  30

## 2017-03-06 ENCOUNTER — Encounter: Payer: Self-pay | Admitting: Cardiovascular Disease

## 2017-03-06 ENCOUNTER — Ambulatory Visit (INDEPENDENT_AMBULATORY_CARE_PROVIDER_SITE_OTHER): Payer: BLUE CROSS/BLUE SHIELD | Admitting: Cardiovascular Disease

## 2017-03-06 VITALS — BP 112/80 | HR 69 | Ht 72.0 in | Wt 230.2 lb

## 2017-03-06 DIAGNOSIS — I2510 Atherosclerotic heart disease of native coronary artery without angina pectoris: Secondary | ICD-10-CM | POA: Diagnosis not present

## 2017-03-06 DIAGNOSIS — E782 Mixed hyperlipidemia: Secondary | ICD-10-CM | POA: Diagnosis not present

## 2017-03-06 MED ORDER — TICAGRELOR 90 MG PO TABS: 90.0000 mg | ORAL_TABLET | Freq: Two times a day (BID) | ORAL | 3 refills | Status: DC

## 2017-03-06 MED ORDER — ATORVASTATIN CALCIUM 80 MG PO TABS: 80.0000 mg | ORAL_TABLET | Freq: Every day | ORAL | 3 refills | Status: DC

## 2017-03-06 MED ORDER — METOPROLOL SUCCINATE ER 25 MG PO TB24: 25.0000 mg | ORAL_TABLET | Freq: Every day | ORAL | 3 refills | Status: DC

## 2017-03-06 NOTE — Patient Instructions (Addendum)
Medication Instructions:  Your physician recommends that you continue on your current medications as directed. Please refer to the Current Medication list given to you today.   Labwork: none  Testing/Procedures: none  Follow-Up: Your physician wants you to follow-up in: 6 months with Dr. Kirke CorinArida. You will receive a reminder letter in the mail two months in advance. If you don't receive a letter, please call our office to schedule the follow-up appointment.   Any Other Special Instructions Will Be Listed Below (If Applicable). Carla with Cardiac Rehab will call you today with an appt, [phone]     If you need a refill on your cardiac medications before your next appointment, please call your pharmacy.

## 2017-03-06 NOTE — Progress Notes (Signed)
Cardiology Office Note   Date:  03/06/2017   ID:  Victor Bill., DOB [birth year], MRN [id number]  PCP:  Patient, No Pcp Per  Cardiologist:   Lorine Bears, MD   Chief Complaint  Patient presents with  . other    3 month follow up. Meds reviewed by the pt. verbally. "doing well."       History of Present Illness: Victor Ibarra. is a 41 y.o. male who presents for a follow-up visit regarding coronary artery disease.  He was hospitalized in July with non-ST elevation myocardial infarction.  Cardiac catheterization showed the percent proximal LAD stenosis with myocardial bridge and evidence of plaque rupture.  He underwent successful angioplasty and drug-eluting stent placement to the LAD.  Ejection fraction was normal. He has been doing extremely well since then with no chest pain, shortness of breath or palpitations.  He started watching his diet more carefully and has lost 30 pounds.  No side effects with medications.    Past Medical History:  Diagnosis Date  . CAD (coronary artery disease)    a. 11/2016 NSTEMI/PCI: LAD 60p w/ bridge.  FFR 0.81-->PCI performed 2/2 ACS/presumed plaque rupture (2.75x18 Xience Alpine DES). OTW nl cors, EF 55-65%.  . MVP (mitral valve prolapse)    a. 11/2014 Echo: EF 55-60%, mild LVh, inferolateral HK, mild MVP, mild MR, nl RV fxn.  . Obesity   . [facility] spotted fever    a. Dx 09/2016 - s/p 2 wks of doxycycline.  . Tick bite     Past Surgical History:  Procedure Laterality Date  . CORONARY STENT INTERVENTION N/A 11/19/2016   Procedure: Coronary Stent Intervention;  Surgeon: Yvonne Kendall, MD;  Location: [facility];  Service: Cardiovascular;  Laterality: N/A;  . INTRAVASCULAR PRESSURE WIRE/FFR STUDY N/A 11/19/2016   Procedure: Intravascular Pressure Wire/FFR Study;  Surgeon: Yvonne Kendall, MD;  Location: [facility];  Service: Cardiovascular;  Laterality: N/A;  . LEFT HEART CATH AND CORONARY  ANGIOGRAPHY N/A 11/19/2016   Procedure: Left Heart Cath and Coronary Angiography;  Surgeon: Yvonne Kendall, MD;  Location: [facility];  Service: Cardiovascular;  Laterality: N/A;     Current Outpatient Prescriptions  Medication Sig Dispense Refill  . aspirin 81 MG chewable tablet Chew 1 tablet (81 mg total) by mouth daily. 30 tablet 2  . atorvastatin (LIPITOR) 80 MG tablet Take 1 tablet (80 mg total) by mouth daily at 6 PM. 30 tablet 0  . metoprolol succinate (TOPROL XL) 25 MG 24 hr tablet Take 1 tablet (25 mg total) by mouth daily. 30 tablet 0  . nitroGLYCERIN (NITROSTAT) 0.4 MG SL tablet Place 1 tablet (0.4 mg total) under the tongue every 5 (five) minutes as needed for chest pain. 30 tablet 2  . ticagrelor (BRILINTA) 90 MG TABS tablet Take 1 tablet (90 mg total) by mouth 2 (two) times daily. 60 tablet 0   No current facility-administered medications for this visit.     Allergies:   Patient has no known allergies.    Social History:  The patient  reports that he has quit smoking. He has never used smokeless tobacco. He reports that he drinks alcohol. He reports that he does not use drugs.   Family History:  The patient's family history includes CAD in his father; Hypertension in his mother; Melanoma in his father; Other in his mother and sister.    ROS:  Please see the history of present illness.  Otherwise, review of systems are positive for none.   All other systems are reviewed and negative.    PHYSICAL EXAM: VS:  BP 112/80 (BP Location: Left Arm, Patient Position: Sitting, Cuff Size: Normal)   Pulse 69   Ht 6' (1.829 m)   Wt 230 lb 4 oz (104.4 kg)   BMI 31.23 kg/m  , BMI Body mass index is 31.23 kg/m. GEN: Well nourished, well developed, in no acute distress  HEENT: normal  Neck: no JVD, carotid bruits, or masses Cardiac: RRR; no murmurs, rubs, or gallops,no edema  Respiratory:  clear to auscultation bilaterally, normal work of breathing GI: soft, nontender,  nondistended, + BS MS: no deformity or atrophy  Skin: warm and dry, no rash Neuro:  Strength and sensation are intact Psych: euthymic mood, full affect   EKG:  EKG is ordered today. The ekg ordered today demonstrates normal sinus rhythm with no significant ST or T wave changes.   Recent Labs: 11/19/2016: BUN 12; Creatinine, Ser 1.00; Hemoglobin 14.1; Platelets 167; Potassium 3.9; Sodium 138 12/24/2016: ALT 31    Lipid Panel    Component Value Date/Time   CHOL 79 12/24/2016 0731   TRIG 56 12/24/2016 0731   HDL 36 (L) 12/24/2016 0731   CHOLHDL 2.2 12/24/2016 0731   VLDL 11 12/24/2016 0731   LDLCALC 32 12/24/2016 0731      Wt Readings from Last 3 Encounters:  03/06/17 230 lb 4 oz (104.4 kg)  12/04/16 259 lb 4 oz (117.6 kg)  11/20/16 256 lb 9.6 oz (116.4 kg)       No flowsheet data found.    ASSESSMENT AND PLAN:  1.  Coronary artery disease involving native coronary arteries without angina: He is doing extremely well with no recurrent anginal symptoms.  Continue medical therapy.  Continue dual antiplatelet therapy  at least until July 2019. I referred the patient back to cardiac rehab.  He is able to attend now.  2.  Hyperlipidemia: Continue high-dose atorvastatin.  Most recent lipid profile showed an LDL of 32.  HDL was low at 36 but hopefully this will continue to improve with exercise.  3.  Obesity: He lost 30 pounds over the last 3 months with healthy lifestyle changes.  I congratulated him.  He will continue his healthy lifestyle change and hopefully improve further with cardiac rehab.    Disposition:   FU with me in 6 months  Signed,  Lorine BearsMuhammad Arida, MD  03/06/2017 8:50 AM    [facility]

## 2017-03-10 ENCOUNTER — Encounter: Payer: BLUE CROSS/BLUE SHIELD | Attending: Cardiovascular Disease | Admitting: *Deleted

## 2017-03-10 ENCOUNTER — Encounter: Payer: Self-pay | Admitting: *Deleted

## 2017-03-10 VITALS — Ht 73.8 in | Wt 229.5 lb

## 2017-03-10 DIAGNOSIS — Z955 Presence of coronary angioplasty implant and graft: Secondary | ICD-10-CM

## 2017-03-10 DIAGNOSIS — I214 Non-ST elevation (NSTEMI) myocardial infarction: Secondary | ICD-10-CM | POA: Insufficient documentation

## 2017-03-10 NOTE — Patient Instructions (Addendum)
Patient Instructions  Patient Details  Name: Victor BillWilliam Robert Aiken Jr. MRN: [id number] Date of Birth: [birth year] Referring Provider:  Iran OuchArida, Muhammad A, MD  Below are the personal goals you chose as well as exercise and nutrition goals. Our goal is to help you keep on track towards obtaining and maintaining your goals. We will be discussing your progress on these goals with you throughout the program.  Initial Exercise Prescription: Initial Exercise Prescription - 03/10/17 1100      Date of Initial Exercise RX and Referring Provider   Date  03/10/17    Referring Provider  Lorine BearsArida, Muhammad MD      Treadmill   MPH  3.5    Grade  0    Minutes  15    METs  3.68      Elliptical   Level  2    Speed  2.7    Minutes  15      T5 Nustep   Level  4    SPM  100    Minutes  15    METs  3      Prescription Details   Frequency (times per week)  3    Duration  Progress to 45 minutes of aerobic exercise without signs/symptoms of physical distress      Intensity   THRR 40-80% of Max Heartrate  109-155    Ratings of Perceived Exertion  11-13    Perceived Dyspnea  0-4      Progression   Progression  Continue to progress workloads to maintain intensity without signs/symptoms of physical distress.      Resistance Training   Training Prescription  Yes    Weight  5 lbs    Reps  10-15       Exercise Goals: Frequency: Be able to perform aerobic exercise three times per week working toward 3-5 days per week.  Intensity: Work with a perceived exertion of 11 (fairly light) - 15 (hard) as tolerated. Follow your new exercise prescription and watch for changes in prescription as you progress with the program. Changes will be reviewed with you when they are made.  Duration: You should be able to do 30 minutes of continuous aerobic exercise in addition to a 5 minute warm-up and a 5 minute cool-down routine.  Nutrition Goals: Your personal nutrition goals will be established when you do  your nutrition analysis with the dietician.  The following are nutrition guidelines to follow: Cholesterol < 200mg /day Sodium < 1500mg /day Fiber: Men under 50 yrs - 38 grams per day  Personal Goals: Personal Goals and Risk Factors at Admission - 03/10/17 1201      Core Components/Risk Factors/Patient Goals on Admission    Weight Management  Yes    Intervention  Weight Management: Develop a combined nutrition and exercise program designed to reach desired caloric intake, while maintaining appropriate intake of nutrient and fiber, sodium and fats, and appropriate energy expenditure required for the weight goal.;Weight Management: Provide education and appropriate resources to help participant work on and attain dietary goals.;Weight Management/Obesity: Establish reasonable short term and long term weight goals.;Obesity: Provide education and appropriate resources to help participant work on and attain dietary goals.    Admit Weight  229 lb 8 oz (104.1 kg)    Goal Weight: Short Term  220 lb (99.8 kg)    Goal Weight: Long Term  200 lb (90.7 kg)    Expected Outcomes  Short Term: Continue to assess and modify interventions until short term  weight is achieved;Long Term: Adherence to nutrition and physical activity/exercise program aimed toward attainment of established weight goal;Weight Maintenance: Understanding of the daily nutrition guidelines, which includes 25-35% calories from fat, 7% or less cal from saturated fats, less than 200mg  cholesterol, less than 1.5gm of sodium, & 5 or more servings of fruits and vegetables daily;Weight Loss: Understanding of general recommendations for a balanced deficit meal plan, which promotes 1-2 lb weight loss per week and includes a negative energy balance of [id number] kcal/d;Understanding recommendations for meals to include 15-35% energy as protein, 25-35% energy from fat, 35-60% energy from carbohydrates, less than 200mg  of dietary cholesterol, 20-35 gm of total  fiber daily;Understanding of distribution of calorie intake throughout the day with the consumption of 4-5 meals/snacks    Tobacco Cessation  Yes    Number of packs per day  -- does not smoke, only chews tobacco   does not smoke, only chews tobacco   Intervention  Assist the participant in steps to quit. Provide individualized education and counseling about committing to Tobacco Cessation, relapse prevention, and pharmacological support that can be provided by physician.;Education officer, environmental, assist with locating and accessing local/national Quit Smoking programs, and support quit date choice. materials offered at initial med review, patient declined intormation and is not ready to quit.    materials offered at initial med review, patient declined intormation and is not ready to quit.    Expected Outcomes  Short Term: Will demonstrate readiness to quit, by selecting a quit date.;Short Term: Will quit all tobacco product use, adhering to prevention of relapse plan.;Long Term: Complete abstinence from all tobacco products for at least 12 months from quit date.    Hypertension  Yes    Intervention  Provide education on lifestyle modifcations including regular physical activity/exercise, weight management, moderate sodium restriction and increased consumption of fresh fruit, vegetables, and low fat dairy, alcohol moderation, and smoking cessation.;Monitor prescription use compliance.    Expected Outcomes  Short Term: Continued assessment and intervention until BP is < 140/39mm HG in hypertensive participants. < 130/31mm HG in hypertensive participants with diabetes, heart failure or chronic kidney disease.;Long Term: Maintenance of blood pressure at goal levels.    Lipids  Yes    Intervention  Provide education and support for participant on nutrition & aerobic/resistive exercise along with prescribed medications to achieve LDL 70mg , HDL >40mg .    Expected Outcomes  Short Term: Participant states  understanding of desired cholesterol values and is compliant with medications prescribed. Participant is following exercise prescription and nutrition guidelines.;Long Term: Cholesterol controlled with medications as prescribed, with individualized exercise RX and with personalized nutrition plan. Value goals: LDL < 70mg , HDL > 40 mg.    Stress  Yes    Intervention  Offer individual and/or small group education and counseling on adjustment to heart disease, stress management and health-related lifestyle change. Teach and support self-help strategies.;Refer participants experiencing significant psychosocial distress to appropriate mental health specialists for further evaluation and treatment. When possible, include family members and significant others in education/counseling sessions.    Expected Outcomes  Short Term: Participant demonstrates changes in health-related behavior, relaxation and other stress management skills, ability to obtain effective social support, and compliance with psychotropic medications if prescribed.;Long Term: Emotional wellbeing is indicated by absence of clinically significant psychosocial distress or social isolation.       Tobacco Use Initial Evaluation: Social History   Tobacco Use  Smoking Status Former Smoker  Smokeless Tobacco Current User  . Types: Chew  Tobacco Comment   smoked some in high school - none since. Chews tobacco, offered cessation materials, patient declined stating he is not ready to quit chewing tobacco at this time since it is his livelyhood.     Exercise Goals and Review: Exercise Goals    Row Name 03/10/17 1140             Exercise Goals   Increase Physical Activity  Yes       Intervention  Provide advice, education, support and counseling about physical activity/exercise needs.;Develop an individualized exercise prescription for aerobic and resistive training based on initial evaluation findings, risk stratification, comorbidities  and participant's personal goals.       Expected Outcomes  Achievement of increased cardiorespiratory fitness and enhanced flexibility, muscular endurance and strength shown through measurements of functional capacity and personal statement of participant.       Increase Strength and Stamina  Yes       Intervention  Develop an individualized exercise prescription for aerobic and resistive training based on initial evaluation findings, risk stratification, comorbidities and participant's personal goals.;Provide advice, education, support and counseling about physical activity/exercise needs.       Expected Outcomes  Achievement of increased cardiorespiratory fitness and enhanced flexibility, muscular endurance and strength shown through measurements of functional capacity and personal statement of participant.       Able to understand and use rate of perceived exertion (RPE) scale  Yes       Intervention  Provide education and explanation on how to use RPE scale       Expected Outcomes  Short Term: Able to use RPE daily in rehab to express subjective intensity level;Long Term:  Able to use RPE to guide intensity level when exercising independently       Knowledge and understanding of Target Heart Rate Range (THRR)  Yes       Intervention  Provide education and explanation of THRR including how the numbers were predicted and where they are located for reference       Expected Outcomes  Short Term: Able to state/look up THRR;Long Term: Able to use THRR to govern intensity when exercising independently;Short Term: Able to use daily as guideline for intensity in rehab       Able to check pulse independently  Yes       Intervention  Provide education and demonstration on how to check pulse in carotid and radial arteries.;Review the importance of being able to check your own pulse for safety during independent exercise       Expected Outcomes  Short Term: Able to explain why pulse checking is important during  independent exercise;Long Term: Able to check pulse independently and accurately       Understanding of Exercise Prescription  Yes       Intervention  Provide education, explanation, and written materials on patient's individual exercise prescription       Expected Outcomes  Short Term: Able to explain program exercise prescription;Long Term: Able to explain home exercise prescription to exercise independently          Copy of goals given to participant.

## 2017-03-10 NOTE — Progress Notes (Signed)
Daily Session Note  Patient Details  Name: Victor Ibarra. MRN: [id number] Date of Birth: 29-Jul-1975 Referring Provider:     Cardiac Rehab from 03/10/2017 in [facility]  Referring Provider  Kathlyn Sacramento MD      Encounter Date: 03/10/2017  Check In: Session Check In - 03/10/17 1156      Check-In   Location  [facility]    Staff Present  Darel Hong, RN BSN;Jessica Luan Pulling, MA, ACSM RCEP, Exercise Physiologist    Supervising physician immediately available to respond to emergencies  See telemetry face sheet for immediately available ER MD    Medication changes reported      No    Fall or balance concerns reported     No    Tobacco Cessation  No Change    Warm-up and Cool-down  Performed as group-led instruction    Resistance Training Performed  Yes    VAD Patient?  No      Pain Assessment   Currently in Pain?  No/denies    Multiple Pain Sites  No        Exercise Prescription Changes - 03/10/17 1100      Response to Exercise   Blood Pressure (Admit)  134/64    Blood Pressure (Exercise)  154/70    Blood Pressure (Exit)  120/60    Heart Rate (Admit)  63 bpm    Heart Rate (Exercise)  98 bpm    Heart Rate (Exit)  70 bpm    Oxygen Saturation (Admit)  99 %    Oxygen Saturation (Exercise)  99 %    Rating of Perceived Exertion (Exercise)  11    Symptoms  none    Comments  walk test results       Social History   Tobacco Use  Smoking Status Former Smoker  Smokeless Tobacco Current User  . Types: Chew  Tobacco Comment   smoked some in high school - none since. Chews tobacco, offered cessation materials, patient declined stating he is not ready to quit chewing tobacco at this time since it is his livelyhood.     Goals Met:  Independence with exercise equipment Exercise tolerated well No report of cardiac concerns or symptoms Strength training completed today  Goals Unmet:  Not Applicable  Comments:  Initial Med review and 28m test.   Dr. MEmily Filbertis Medical Director [facility].

## 2017-03-10 NOTE — Progress Notes (Signed)
Cardiac Individual Treatment Plan  Patient Details  Name: Victor Ibarra. MRN: [id number] Date of Birth: 08/13/1975 Referring Provider:     Cardiac Rehab from 03/10/2017 in [facility] Cardiac and Pulmonary Rehab  Referring Provider  Lorine Bears MD      Initial Encounter Date:    Cardiac Rehab from 03/10/2017 in [facility] Cardiac and Pulmonary Rehab  Date  03/10/17  Referring Provider  Lorine Bears MD      Visit Diagnosis: NSTEMI (non-ST elevated myocardial infarction) [facility])  Status post coronary artery stent placement  Patient's Home Medications on Admission:  Current Outpatient Medications:  .  aspirin 81 MG chewable tablet, Chew 1 tablet (81 mg total) by mouth daily., Disp: 30 tablet, Rfl: 2 .  atorvastatin (LIPITOR) 80 MG tablet, Take 1 tablet (80 mg total) by mouth daily at 6 PM., Disp: 90 tablet, Rfl: 3 .  metoprolol succinate (TOPROL XL) 25 MG 24 hr tablet, Take 1 tablet (25 mg total) by mouth daily., Disp: 90 tablet, Rfl: 3 .  nitroGLYCERIN (NITROSTAT) 0.4 MG SL tablet, Place 1 tablet (0.4 mg total) under the tongue every 5 (five) minutes as needed for chest pain., Disp: 30 tablet, Rfl: 2 .  ticagrelor (BRILINTA) 90 MG TABS tablet, Take 1 tablet (90 mg total) by mouth 2 (two) times daily., Disp: 180 tablet, Rfl: 3  Past Medical History: Past Medical History:  Diagnosis Date  . CAD (coronary artery disease)    a. 11/2016 NSTEMI/PCI: LAD 60p w/ bridge.  FFR 0.81-->PCI performed 2/2 ACS/presumed plaque rupture (2.75x18 Xience Alpine DES). OTW nl cors, EF 55-65%.  . MVP (mitral valve prolapse)    a. 11/2014 Echo: EF 55-60%, mild LVh, inferolateral HK, mild MVP, mild MR, nl RV fxn.  . Obesity   . [facility] spotted fever    a. Dx 09/2016 - s/p 2 wks of doxycycline.  . Tick bite     Tobacco Use: Social History   Tobacco Use  Smoking Status Former Smoker  Smokeless Tobacco Current User  . Types: Chew  Tobacco Comment   smoked some in high school - none  since. Chews tobacco, offered cessation materials, patient declined stating he is not ready to quit chewing tobacco at this time since it is his livelyhood.     Labs: Recent Review Flowsheet Data    Labs for ITP Cardiac and Pulmonary Rehab Latest Ref Rng & Units 11/18/2016 11/19/2016 12/24/2016   Cholestrol 0 - 200 mg/dL 409 - 79   LDLCALC 0 - 99 mg/dL 93 - 32   HDL >81 mg/dL 19(J) - 47(W)   Trlycerides <150 mg/dL 295 - 56   Hemoglobin A2Z 4.8 - 5.6 % - 5.2 -       Exercise Target Goals: Date: 03/10/17  Exercise Program Goal: Individual exercise prescription set with THRR, safety & activity barriers. Participant demonstrates ability to understand and report RPE using BORG scale, to self-measure pulse accurately, and to acknowledge the importance of the exercise prescription.  Exercise Prescription Goal: Starting with aerobic activity 30 plus minutes a day, 3 days per week for initial exercise prescription. Provide home exercise prescription and guidelines that participant acknowledges understanding prior to discharge.  Activity Barriers & Risk Stratification: Activity Barriers & Cardiac Risk Stratification - 03/10/17 1209      Activity Barriers & Cardiac Risk Stratification   Activity Barriers  Back Problems sees a chiropractor approx once a month   sees a chiropractor approx once a month   Cardiac Risk Stratification  Moderate       6 Minute Walk: 6 Minute Walk    Row Name 03/10/17 1131         6 Minute Walk   Phase  Initial     Distance  1888 feet     Walk Time  6 minutes     # of Rest Breaks  0     MPH  3.58     METS  5.55     RPE  11     VO2 Peak  19.43     Symptoms  No     Resting HR  63 bpm     Resting BP  134/64     Resting Oxygen Saturation   99 %     Exercise Oxygen Saturation  during 6 min walk  99 %     Max Ex. HR  98 bpm     Max Ex. BP  154/70     2 Minute Post BP  120/60        Oxygen Initial Assessment:   Oxygen Re-Evaluation:   Oxygen  Discharge (Final Oxygen Re-Evaluation):   Initial Exercise Prescription: Initial Exercise Prescription - 03/10/17 1100      Date of Initial Exercise RX and Referring Provider   Date  03/10/17    Referring Provider  Lorine Bears MD      Treadmill   MPH  3.5    Grade  0    Minutes  15    METs  3.68      Elliptical   Level  2    Speed  2.7    Minutes  15      T5 Nustep   Level  4    SPM  100    Minutes  15    METs  3      Prescription Details   Frequency (times per week)  3    Duration  Progress to 45 minutes of aerobic exercise without signs/symptoms of physical distress      Intensity   THRR 40-80% of Max Heartrate  109-155    Ratings of Perceived Exertion  11-13    Perceived Dyspnea  0-4      Progression   Progression  Continue to progress workloads to maintain intensity without signs/symptoms of physical distress.      Resistance Training   Training Prescription  Yes    Weight  5 lbs    Reps  10-15       Perform Capillary Blood Glucose checks as needed.  Exercise Prescription Changes:  Exercise Prescription Changes    Row Name 03/10/17 1100             Response to Exercise   Blood Pressure (Admit)  134/64       Blood Pressure (Exercise)  154/70       Blood Pressure (Exit)  120/60       Heart Rate (Admit)  63 bpm       Heart Rate (Exercise)  98 bpm       Heart Rate (Exit)  70 bpm       Oxygen Saturation (Admit)  99 %       Oxygen Saturation (Exercise)  99 %       Rating of Perceived Exertion (Exercise)  11       Symptoms  none       Comments  walk test results          Exercise Comments:  Exercise Goals and Review:  Exercise Goals    Row Name 03/10/17 1140             Exercise Goals   Increase Physical Activity  Yes       Intervention  Provide advice, education, support and counseling about physical activity/exercise needs.;Develop an individualized exercise prescription for aerobic and resistive training based on initial  evaluation findings, risk stratification, comorbidities and participant's personal goals.       Expected Outcomes  Achievement of increased cardiorespiratory fitness and enhanced flexibility, muscular endurance and strength shown through measurements of functional capacity and personal statement of participant.       Increase Strength and Stamina  Yes       Intervention  Develop an individualized exercise prescription for aerobic and resistive training based on initial evaluation findings, risk stratification, comorbidities and participant's personal goals.;Provide advice, education, support and counseling about physical activity/exercise needs.       Expected Outcomes  Achievement of increased cardiorespiratory fitness and enhanced flexibility, muscular endurance and strength shown through measurements of functional capacity and personal statement of participant.       Able to understand and use rate of perceived exertion (RPE) scale  Yes       Intervention  Provide education and explanation on how to use RPE scale       Expected Outcomes  Short Term: Able to use RPE daily in rehab to express subjective intensity level;Long Term:  Able to use RPE to guide intensity level when exercising independently       Knowledge and understanding of Target Heart Rate Range (THRR)  Yes       Intervention  Provide education and explanation of THRR including how the numbers were predicted and where they are located for reference       Expected Outcomes  Short Term: Able to state/look up THRR;Long Term: Able to use THRR to govern intensity when exercising independently;Short Term: Able to use daily as guideline for intensity in rehab       Able to check pulse independently  Yes       Intervention  Provide education and demonstration on how to check pulse in carotid and radial arteries.;Review the importance of being able to check your own pulse for safety during independent exercise       Expected Outcomes  Short Term:  Able to explain why pulse checking is important during independent exercise;Long Term: Able to check pulse independently and accurately       Understanding of Exercise Prescription  Yes       Intervention  Provide education, explanation, and written materials on patient's individual exercise prescription       Expected Outcomes  Short Term: Able to explain program exercise prescription;Long Term: Able to explain home exercise prescription to exercise independently          Exercise Goals Re-Evaluation :   Discharge Exercise Prescription (Final Exercise Prescription Changes): Exercise Prescription Changes - 03/10/17 1100      Response to Exercise   Blood Pressure (Admit)  134/64    Blood Pressure (Exercise)  154/70    Blood Pressure (Exit)  120/60    Heart Rate (Admit)  63 bpm    Heart Rate (Exercise)  98 bpm    Heart Rate (Exit)  70 bpm    Oxygen Saturation (Admit)  99 %    Oxygen Saturation (Exercise)  99 %    Rating of Perceived Exertion (Exercise)  11  Symptoms  none    Comments  walk test results       Nutrition:  Target Goals: Understanding of nutrition guidelines, daily intake of sodium 1500mg , cholesterol 200mg , calories 30% from fat and 7% or less from saturated fats, daily to have 5 or more servings of fruits and vegetables.  Biometrics: Pre Biometrics - 03/10/17 1140      Pre Biometrics   Height  6' 1.8" (1.875 m)    Weight  229 lb 8 oz (104.1 kg)    Waist Circumference  36 inches    Hip Circumference  43 inches    Waist to Hip Ratio  0.84 %    BMI (Calculated)  29.61    Single Leg Stand  30 seconds        Nutrition Therapy Plan and Nutrition Goals: Nutrition Therapy & Goals - 03/10/17 1206      Intervention Plan   Intervention  Prescribe, educate and counsel regarding individualized specific dietary modifications aiming towards targeted core components such as weight, hypertension, lipid management, diabetes, heart failure and other  comorbidities.;Nutrition handout(s) given to patient.    Expected Outcomes  Short Term Goal: A plan has been developed with personal nutrition goals set during dietitian appointment.;Long Term Goal: Adherence to prescribed nutrition plan.;Short Term Goal: Understand basic principles of dietary content, such as calories, fat, sodium, cholesterol and nutrients.       Nutrition Discharge: Rate Your Plate Scores: Nutrition Assessments - 03/10/17 1206      MEDFICTS Scores   Pre Score  26       Nutrition Goals Re-Evaluation:   Nutrition Goals Discharge (Final Nutrition Goals Re-Evaluation):   Psychosocial: Target Goals: Acknowledge presence or absence of significant depression and/or stress, maximize coping skills, provide positive support system. Participant is able to verbalize types and ability to use techniques and skills needed for reducing stress and depression.   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       Family Dynamics   Good Support System?  Yes wife and extended family   wife and extended family     Barriers   Psychosocial barriers to participate in program  The patient should benefit from training in stress management and relaxation.      Screening Interventions   Interventions  Yes    Expected Outcomes  Short Term goal: Utilizing psychosocial counselor, staff and physician to assist with identification of specific Stressors or current issues interfering with healing process. Setting desired goal for each stressor or current issue identified.;Long Term Goal: Stressors or current issues are controlled or eliminated.;Long Term  goal: The participant improves quality of Life and PHQ9 Scores as seen by post scores and/or verbalization of changes       Quality of Life Scores:  Quality of Life - 03/10/17 1141      Quality of Life Scores   Health/Function Pre  28.43 %    Socioeconomic Pre  25.83 %    Psych/Spiritual Pre  29.17 %    Family Pre  23.88 %    GLOBAL Pre  27.48 %  PHQ-9: Recent Review Flowsheet Data    Depression screen [facility] 2/9 03/10/2017   Decreased Interest 0   Down, Depressed, Hopeless 0   PHQ - 2 Score 0   Altered sleeping 0   Tired, decreased energy 0   Change in appetite 0   Feeling bad or failure about yourself  0   Trouble concentrating 0   Moving slowly or fidgety/restless 0   Suicidal thoughts 0   PHQ-9 Score 0   Difficult doing work/chores Not difficult at all     Interpretation of Total Score  Total Score Depression Severity:  1-4 = Minimal depression, 5-9 = Mild depression, 10-14 = Moderate depression, 15-19 = Moderately severe depression, 20-27 = Severe depression   Psychosocial Evaluation and Intervention:   Psychosocial Re-Evaluation:   Psychosocial Discharge (Final Psychosocial Re-Evaluation):   Vocational Rehabilitation: Provide vocational rehab assistance to qualifying candidates.   Vocational Rehab Evaluation & Intervention: Vocational Rehab - 03/10/17 1211      Initial Vocational Rehab Evaluation & Intervention   Assessment shows need for Vocational Rehabilitation  No       Education: Education Goals: Education classes will be provided on a variety of topics geared toward better understanding of heart health and risk factor modification. Participant will state understanding/return demonstration of topics presented as noted by education test scores.  Learning Barriers/Preferences: Learning Barriers/Preferences - 03/10/17 1210      Learning Barriers/Preferences   Learning Barriers  None    Learning Preferences  None       Education  Topics: General Nutrition Guidelines/Fats and Fiber: -Group instruction provided by verbal, written material, models and posters to present the general guidelines for heart healthy nutrition. Gives an explanation and review of dietary fats and fiber.   Controlling Sodium/Reading Food Labels: -Group verbal and written material supporting the discussion of sodium use in heart healthy nutrition. Review and explanation with models, verbal and written materials for utilization of the food label.   Exercise Physiology & Risk Factors: - Group verbal and written instruction with models to review the exercise physiology of the cardiovascular system and associated critical values. Details cardiovascular disease risk factors and the goals associated with each risk factor.   Aerobic Exercise & Resistance Training: - Gives group verbal and written discussion on the health impact of inactivity. On the components of aerobic and resistive training programs and the benefits of this training and how to safely progress through these programs.   Flexibility, Balance, General Exercise Guidelines: - Provides group verbal and written instruction on the benefits of flexibility and balance training programs. Provides general exercise guidelines with specific guidelines to those with heart or lung disease. Demonstration and skill practice provided.   Stress Management: - Provides group verbal and written instruction about the health risks of elevated stress, cause of high stress, and healthy ways to reduce stress.   Depression: - Provides group verbal and written instruction on the correlation between heart/lung disease and depressed mood, treatment options, and the stigmas associated with seeking treatment.   Anatomy & Physiology of the Heart: - Group verbal and written instruction and models provide basic cardiac anatomy and physiology, with the coronary electrical and arterial systems. Review of: AMI, Angina,  Valve disease, Heart Failure, Cardiac Arrhythmia, Pacemakers, and the ICD.   Cardiac Procedures: - Group verbal and written instruction to review commonly prescribed medications for heart disease. Reviews the medication, class of the drug, and side effects. Includes the steps to properly store meds and maintain the  prescription regimen. (beta blockers and nitrates)   Cardiac Medications I: - Group verbal and written instruction to review commonly prescribed medications for heart disease. Reviews the medication, class of the drug, and side effects. Includes the steps to properly store meds and maintain the prescription regimen.   Cardiac Medications II: -Group verbal and written instruction to review commonly prescribed medications for heart disease. Reviews the medication, class of the drug, and side effects. (all other drug classes)    Go Sex-Intimacy & Heart Disease, Get SMART - Goal Setting: - Group verbal and written instruction through game format to discuss heart disease and the return to sexual intimacy. Provides group verbal and written material to discuss and apply goal setting through the application of the S.M.A.R.T. Method.   Other Matters of the Heart: - Provides group verbal, written materials and models to describe Heart Failure, Angina, Valve Disease, Peripheral Artery Disease, and Diabetes in the realm of heart disease. Includes description of the disease process and treatment options available to the cardiac patient.   Exercise & Equipment Safety: - Individual verbal instruction and demonstration of equipment use and safety with use of the equipment.   Cardiac Rehab from 03/10/2017 in [facility] Cardiac and Pulmonary Rehab  Date  03/10/17  Educator  KS  Instruction Review Code  1- Verbalizes Understanding      Infection Prevention: - Provides verbal and written material to individual with discussion of infection control including proper hand washing and proper equipment  cleaning during exercise session.   Cardiac Rehab from 03/10/2017 in [facility] Cardiac and Pulmonary Rehab  Date  03/10/17  Educator  KS  Instruction Review Code  1- Verbalizes Understanding      Falls Prevention: - Provides verbal and written material to individual with discussion of falls prevention and safety.   Cardiac Rehab from 03/10/2017 in [facility] Cardiac and Pulmonary Rehab  Date  03/10/17  Educator  KS  Instruction Review Code  1- Verbalizes Understanding      Diabetes: - Individual verbal and written instruction to review signs/symptoms of diabetes, desired ranges of glucose level fasting, after meals and with exercise. Acknowledge that pre and post exercise glucose checks will be done for 3 sessions at entry of program.   Other: -Provides group and verbal instruction on various topics (see comments)    Knowledge Questionnaire Score: Knowledge Questionnaire Score - 03/10/17 1210      Knowledge Questionnaire Score   Pre Score  23/28 Correct answers reviewed with patient who verbalized understanding.   Correct answers reviewed with patient who verbalized understanding.      Core Components/Risk Factors/Patient Goals at Admission: Personal Goals and Risk Factors at Admission - 03/10/17 1201      Core Components/Risk Factors/Patient Goals on Admission    Weight Management  Yes    Intervention  Weight Management: Develop a combined nutrition and exercise program designed to reach desired caloric intake, while maintaining appropriate intake of nutrient and fiber, sodium and fats, and appropriate energy expenditure required for the weight goal.;Weight Management: Provide education and appropriate resources to help participant work on and attain dietary goals.;Weight Management/Obesity: Establish reasonable short term and long term weight goals.;Obesity: Provide education and appropriate resources to help participant work on and attain dietary goals.    Admit Weight  229 lb 8 oz  (104.1 kg)    Goal Weight: Short Term  220 lb (99.8 kg)    Goal Weight: Long Term  200 lb (90.7 kg)    Expected Outcomes  Short Term: Continue to assess and modify interventions until short term weight is achieved;Long Term: Adherence to nutrition and physical activity/exercise program aimed toward attainment of established weight goal;Weight Maintenance: Understanding of the daily nutrition guidelines, which includes 25-35% calories from fat, 7% or less cal from saturated fats, less than 200mg  cholesterol, less than 1.5gm of sodium, & 5 or more servings of fruits and vegetables daily;Weight Loss: Understanding of general recommendations for a balanced deficit meal plan, which promotes 1-2 lb weight loss per week and includes a negative energy balance of [phone] kcal/d;Understanding recommendations for meals to include 15-35% energy as protein, 25-35% energy from fat, 35-60% energy from carbohydrates, less than 200mg  of dietary cholesterol, 20-35 gm of total fiber daily;Understanding of distribution of calorie intake throughout the day with the consumption of 4-5 meals/snacks    Tobacco Cessation  Yes    Number of packs per day  -- does not smoke, only chews tobacco   does not smoke, only chews tobacco   Intervention  Assist the participant in steps to quit. Provide individualized education and counseling about committing to Tobacco Cessation, relapse prevention, and pharmacological support that can be provided by physician.;Education officer, environmentalffer self-teaching materials, assist with locating and accessing local/national Quit Smoking programs, and support quit date choice. materials offered at initial med review, patient declined intormation and is not ready to quit.    materials offered at initial med review, patient declined intormation and is not ready to quit.    Expected Outcomes  Short Term: Will demonstrate readiness to quit, by selecting a quit date.;Short Term: Will quit all tobacco product use, adhering to  prevention of relapse plan.;Long Term: Complete abstinence from all tobacco products for at least 12 months from quit date.    Hypertension  Yes    Intervention  Provide education on lifestyle modifcations including regular physical activity/exercise, weight management, moderate sodium restriction and increased consumption of fresh fruit, vegetables, and low fat dairy, alcohol moderation, and smoking cessation.;Monitor prescription use compliance.    Expected Outcomes  Short Term: Continued assessment and intervention until BP is < 140/6390mm HG in hypertensive participants. < 130/5580mm HG in hypertensive participants with diabetes, heart failure or chronic kidney disease.;Long Term: Maintenance of blood pressure at goal levels.    Lipids  Yes    Intervention  Provide education and support for participant on nutrition & aerobic/resistive exercise along with prescribed medications to achieve LDL 70mg , HDL >40mg .    Expected Outcomes  Short Term: Participant states understanding of desired cholesterol values and is compliant with medications prescribed. Participant is following exercise prescription and nutrition guidelines.;Long Term: Cholesterol controlled with medications as prescribed, with individualized exercise RX and with personalized nutrition plan. Value goals: LDL < 70mg , HDL > 40 mg.    Stress  Yes    Intervention  Offer individual and/or small group education and counseling on adjustment to heart disease, stress management and health-related lifestyle change. Teach and support self-help strategies.;Refer participants experiencing significant psychosocial distress to appropriate mental health specialists for further evaluation and treatment. When possible, include family members and significant others in education/counseling sessions.    Expected Outcomes  Short Term: Participant demonstrates changes in health-related behavior, relaxation and other stress management skills, ability to obtain  effective social support, and compliance with psychotropic medications if prescribed.;Long Term: Emotional wellbeing is indicated by absence of clinically significant psychosocial distress or social isolation.       Core Components/Risk Factors/Patient Goals Review:    Core Components/Risk Factors/Patient Goals at  Discharge (Final Review):    ITP Comments: ITP Comments    Row Name 03/10/17 1158           ITP Comments  Medical Review Completed; initial ITP created. Diagnosis Documentation can be found in [facility] encounter dated 11/18/2016 and 03/06/2017.          Comments: Intial ITP

## 2017-03-12 ENCOUNTER — Encounter: Payer: Self-pay | Admitting: *Deleted

## 2017-03-12 DIAGNOSIS — I214 Non-ST elevation (NSTEMI) myocardial infarction: Secondary | ICD-10-CM

## 2017-03-12 DIAGNOSIS — Z955 Presence of coronary angioplasty implant and graft: Secondary | ICD-10-CM

## 2017-03-12 NOTE — Progress Notes (Signed)
Cardiac Individual Treatment Plan  Patient Details  Name: Victor BillWilliam Robert Schlegel Jr. MRN: [id number] Date of Birth: 17-Oct-1975 Referring Provider:     [facility]  Referring Provider  Lorine BearsArida, Muhammad MD      Initial Encounter Date:    [facility]  Date  03/10/17  Referring Provider  Lorine BearsArida, Muhammad MD      Visit Diagnosis: NSTEMI (non-ST elevated myocardial infarction) [facility](HCC)  Status post coronary artery stent placement  Patient's Home Medications on Admission:  Current Outpatient Medications:  .  aspirin 81 MG chewable tablet, Chew 1 tablet (81 mg total) by mouth daily., Disp: 30 tablet, Rfl: 2 .  atorvastatin (LIPITOR) 80 MG tablet, Take 1 tablet (80 mg total) by mouth daily at 6 PM., Disp: 90 tablet, Rfl: 3 .  metoprolol succinate (TOPROL XL) 25 MG 24 hr tablet, Take 1 tablet (25 mg total) by mouth daily., Disp: 90 tablet, Rfl: 3 .  nitroGLYCERIN (NITROSTAT) 0.4 MG SL tablet, Place 1 tablet (0.4 mg total) under the tongue every 5 (five) minutes as needed for chest pain., Disp: 30 tablet, Rfl: 2 .  ticagrelor (BRILINTA) 90 MG TABS tablet, Take 1 tablet (90 mg total) by mouth 2 (two) times daily., Disp: 180 tablet, Rfl: 3  Past Medical History: Past Medical History:  Diagnosis Date  . CAD (coronary artery disease)    a. 11/2016 NSTEMI/PCI: LAD 60p w/ bridge.  FFR 0.81-->PCI performed 2/2 ACS/presumed plaque rupture (2.75x18 Xience Alpine DES). OTW nl cors, EF 55-65%.  . MVP (mitral valve prolapse)    a. 11/2014 Echo: EF 55-60%, mild LVh, inferolateral HK, mild MVP, mild MR, nl RV fxn.  . Obesity   . [facility]    a. Dx 09/2016 - s/p 2 wks of doxycycline.  . Tick bite     Tobacco Use: Social History   Tobacco Use  Smoking Status Former Smoker  Smokeless Tobacco Current User  . Types: Chew  Tobacco Comment   smoked some in high school - none  since. Chews tobacco, offered cessation materials, patient declined stating he is not ready to quit chewing tobacco at this time since it is his livelyhood.     Labs: Recent Review Flowsheet Data    Labs for ITP Cardiac and Pulmonary Rehab Latest Ref Rng & Units 11/18/2016 11/19/2016 12/24/2016   Cholestrol 0 - 200 mg/dL 952157 - 79   LDLCALC 0 - 99 mg/dL 93 - 32   HDL >84>40 mg/dL 13(K37(L) - 44(W36(L)   Trlycerides <150 mg/dL 102137 - 56   Hemoglobin V2ZA1c 4.8 - 5.6 % - 5.2 -       Exercise Target Goals:    Exercise Program Goal: Individual exercise prescription set with THRR, safety & activity barriers. Participant demonstrates ability to understand and report RPE using BORG scale, to self-measure pulse accurately, and to acknowledge the importance of the exercise prescription.  Exercise Prescription Goal: Starting with aerobic activity 30 plus minutes a day, 3 days per week for initial exercise prescription. Provide home exercise prescription and guidelines that participant acknowledges understanding prior to discharge.  Activity Barriers & Risk Stratification: Activity Barriers & Cardiac Risk Stratification - 03/10/17 1209      Activity Barriers & Cardiac Risk Stratification   Activity Barriers  Back Problems sees a chiropractor approx once a month   sees a chiropractor approx once a month   Cardiac Risk Stratification  Moderate       6 Minute Walk: 6 Minute Walk    Row Name 03/10/17 1131         6 Minute Walk   Phase  Initial     Distance  1888 feet     Walk Time  6 minutes     # of Rest Breaks  0     MPH  3.58     METS  5.55     RPE  11     VO2 Peak  19.43     Symptoms  No     Resting HR  63 bpm     Resting BP  134/64     Resting Oxygen Saturation   99 %     Exercise Oxygen Saturation  during 6 min walk  99 %     Max Ex. HR  98 bpm     Max Ex. BP  154/70     2 Minute Post BP  120/60        Oxygen Initial Assessment:   Oxygen Re-Evaluation:   Oxygen Discharge (Final  Oxygen Re-Evaluation):   Initial Exercise Prescription: Initial Exercise Prescription - 03/10/17 1100      Date of Initial Exercise RX and Referring Provider   Date  03/10/17    Referring Provider  Lorine Bears MD      Treadmill   MPH  3.5    Grade  0    Minutes  15    METs  3.68      Elliptical   Level  2    Speed  2.7    Minutes  15      T5 Nustep   Level  4    SPM  100    Minutes  15    METs  3      Prescription Details   Frequency (times per week)  3    Duration  Progress to 45 minutes of aerobic exercise without signs/symptoms of physical distress      Intensity   THRR 40-80% of Max Heartrate  109-155    Ratings of Perceived Exertion  11-13    Perceived Dyspnea  0-4      Progression   Progression  Continue to progress workloads to maintain intensity without signs/symptoms of physical distress.      Resistance Training   Training Prescription  Yes    Weight  5 lbs    Reps  10-15       Perform Capillary Blood Glucose checks as needed.  Exercise Prescription Changes: Exercise Prescription Changes    Row Name 03/10/17 1100             Response to Exercise   Blood Pressure (Admit)  134/64       Blood Pressure (Exercise)  154/70       Blood Pressure (Exit)  120/60       Heart Rate (Admit)  63 bpm       Heart Rate (Exercise)  98 bpm       Heart Rate (Exit)  70 bpm       Oxygen Saturation (Admit)  99 %       Oxygen Saturation (Exercise)  99 %       Rating of Perceived Exertion (Exercise)  11       Symptoms  none       Comments  walk test results          Exercise Comments:  Exercise Goals and Review: Exercise Goals    Row Name 03/10/17 1140             Exercise Goals   Increase Physical Activity  Yes       Intervention  Provide advice, education, support and counseling about physical activity/exercise needs.;Develop an individualized exercise prescription for aerobic and resistive training based on initial evaluation findings, risk  stratification, comorbidities and participant's personal goals.       Expected Outcomes  Achievement of increased cardiorespiratory fitness and enhanced flexibility, muscular endurance and strength shown through measurements of functional capacity and personal statement of participant.       Increase Strength and Stamina  Yes       Intervention  Develop an individualized exercise prescription for aerobic and resistive training based on initial evaluation findings, risk stratification, comorbidities and participant's personal goals.;Provide advice, education, support and counseling about physical activity/exercise needs.       Expected Outcomes  Achievement of increased cardiorespiratory fitness and enhanced flexibility, muscular endurance and strength shown through measurements of functional capacity and personal statement of participant.       Able to understand and use rate of perceived exertion (RPE) scale  Yes       Intervention  Provide education and explanation on how to use RPE scale       Expected Outcomes  Short Term: Able to use RPE daily in rehab to express subjective intensity level;Long Term:  Able to use RPE to guide intensity level when exercising independently       Knowledge and understanding of Target Heart Rate Range (THRR)  Yes       Intervention  Provide education and explanation of THRR including how the numbers were predicted and where they are located for reference       Expected Outcomes  Short Term: Able to state/look up THRR;Long Term: Able to use THRR to govern intensity when exercising independently;Short Term: Able to use daily as guideline for intensity in rehab       Able to check pulse independently  Yes       Intervention  Provide education and demonstration on how to check pulse in carotid and radial arteries.;Review the importance of being able to check your own pulse for safety during independent exercise       Expected Outcomes  Short Term: Able to explain why pulse  checking is important during independent exercise;Long Term: Able to check pulse independently and accurately       Understanding of Exercise Prescription  Yes       Intervention  Provide education, explanation, and written materials on patient's individual exercise prescription       Expected Outcomes  Short Term: Able to explain program exercise prescription;Long Term: Able to explain home exercise prescription to exercise independently          Exercise Goals Re-Evaluation :   Discharge Exercise Prescription (Final Exercise Prescription Changes): Exercise Prescription Changes - 03/10/17 1100      Response to Exercise   Blood Pressure (Admit)  134/64    Blood Pressure (Exercise)  154/70    Blood Pressure (Exit)  120/60    Heart Rate (Admit)  63 bpm    Heart Rate (Exercise)  98 bpm    Heart Rate (Exit)  70 bpm    Oxygen Saturation (Admit)  99 %    Oxygen Saturation (Exercise)  99 %    Rating of Perceived Exertion (Exercise)  11  Symptoms  none    Comments  walk test results       Nutrition:  Target Goals: Understanding of nutrition guidelines, daily intake of sodium 1500mg , cholesterol 200mg , calories 30% from fat and 7% or less from saturated fats, daily to have 5 or more servings of fruits and vegetables.  Biometrics: Pre Biometrics - 03/10/17 1140      Pre Biometrics   Height  6' 1.8" (1.875 m)    Weight  229 lb 8 oz (104.1 kg)    Waist Circumference  36 inches    Hip Circumference  43 inches    Waist to Hip Ratio  0.84 %    BMI (Calculated)  29.61    Single Leg Stand  30 seconds        Nutrition Therapy Plan and Nutrition Goals: Nutrition Therapy & Goals - 03/10/17 1206      Intervention Plan   Intervention  Prescribe, educate and counsel regarding individualized specific dietary modifications aiming towards targeted core components such as weight, hypertension, lipid management, diabetes, heart failure and other comorbidities.;Nutrition handout(s) given to  patient.    Expected Outcomes  Short Term Goal: A plan has been developed with personal nutrition goals set during dietitian appointment.;Long Term Goal: Adherence to prescribed nutrition plan.;Short Term Goal: Understand basic principles of dietary content, such as calories, fat, sodium, cholesterol and nutrients.       Nutrition Discharge: Rate Your Plate Scores: Nutrition Assessments - 03/10/17 1206      MEDFICTS Scores   Pre Score  26       Nutrition Goals Re-Evaluation:   Nutrition Goals Discharge (Final Nutrition Goals Re-Evaluation):   Psychosocial: Target Goals: Acknowledge presence or absence of significant depression and/or stress, maximize coping skills, provide positive support system. Participant is able to verbalize types and ability to use techniques and skills needed for reducing stress and depression.   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       Family Dynamics   Good Support System?  Yes wife and extended family   wife and extended family     Barriers   Psychosocial barriers to participate in program  The patient should benefit from training in stress management and relaxation.      Screening Interventions   Interventions  Yes    Expected Outcomes  Short Term goal: Utilizing psychosocial counselor, staff and physician to assist with identification of specific Stressors or current issues interfering with healing process. Setting desired goal for each stressor or current issue identified.;Long Term Goal: Stressors or current issues are controlled or eliminated.;Long Term goal: The participant improves quality of  Life and PHQ9 Scores as seen by post scores and/or verbalization of changes       Quality of Life Scores:  Quality of Life - 03/10/17 1141      Quality of Life Scores   Health/Function Pre  28.43 %    Socioeconomic Pre  25.83 %    Psych/Spiritual Pre  29.17 %    Family Pre  23.88 %    GLOBAL Pre  27.48 %  PHQ-9: Recent Review Flowsheet Data    Depression screen [facility] 2/9 03/10/2017   Decreased Interest 0   Down, Depressed, Hopeless 0   PHQ - 2 Score 0   Altered sleeping 0   Tired, decreased energy 0   Change in appetite 0   Feeling bad or failure about yourself  0   Trouble concentrating 0   Moving slowly or fidgety/restless 0   Suicidal thoughts 0   PHQ-9 Score 0   Difficult doing work/chores Not difficult at all     Interpretation of Total Score  Total Score Depression Severity:  1-4 = Minimal depression, 5-9 = Mild depression, 10-14 = Moderate depression, 15-19 = Moderately severe depression, 20-27 = Severe depression   Psychosocial Evaluation and Intervention:   Psychosocial Re-Evaluation:   Psychosocial Discharge (Final Psychosocial Re-Evaluation):   Vocational Rehabilitation: Provide vocational rehab assistance to qualifying candidates.   Vocational Rehab Evaluation & Intervention: Vocational Rehab - 03/10/17 1211      Initial Vocational Rehab Evaluation & Intervention   Assessment shows need for Vocational Rehabilitation  No       Education: Education Goals: Education classes will be provided on a variety of topics geared toward better understanding of heart health and risk factor modification. Participant will state understanding/return demonstration of topics presented as noted by education test scores.  Learning Barriers/Preferences: Learning Barriers/Preferences - 03/10/17 1210      Learning Barriers/Preferences   Learning Barriers  None    Learning Preferences  None       Education Topics: General Nutrition Guidelines/Fats and  Fiber: -Group instruction provided by verbal, written material, models and posters to present the general guidelines for heart healthy nutrition. Gives an explanation and review of dietary fats and fiber.   Controlling Sodium/Reading Food Labels: -Group verbal and written material supporting the discussion of sodium use in heart healthy nutrition. Review and explanation with models, verbal and written materials for utilization of the food label.   Exercise Physiology & Risk Factors: - Group verbal and written instruction with models to review the exercise physiology of the cardiovascular system and associated critical values. Details cardiovascular disease risk factors and the goals associated with each risk factor.   Aerobic Exercise & Resistance Training: - Gives group verbal and written discussion on the health impact of inactivity. On the components of aerobic and resistive training programs and the benefits of this training and how to safely progress through these programs.   Flexibility, Balance, General Exercise Guidelines: - Provides group verbal and written instruction on the benefits of flexibility and balance training programs. Provides general exercise guidelines with specific guidelines to those with heart or lung disease. Demonstration and skill practice provided.   Stress Management: - Provides group verbal and written instruction about the health risks of elevated stress, cause of high stress, and healthy ways to reduce stress.   Depression: - Provides group verbal and written instruction on the correlation between heart/lung disease and depressed mood, treatment options, and the stigmas associated with seeking treatment.   Anatomy & Physiology of the Heart: - Group verbal and written instruction and models provide basic cardiac anatomy and physiology, with the coronary electrical and arterial systems. Review of: AMI, Angina, Valve disease, Heart Failure, Cardiac  Arrhythmia, Pacemakers, and the ICD.   Cardiac Procedures: - Group verbal and written instruction to review commonly prescribed medications for heart disease. Reviews the medication, class of the drug, and side effects. Includes the steps to properly store meds and maintain the  prescription regimen. (beta blockers and nitrates)   Cardiac Medications I: - Group verbal and written instruction to review commonly prescribed medications for heart disease. Reviews the medication, class of the drug, and side effects. Includes the steps to properly store meds and maintain the prescription regimen.   Cardiac Medications II: -Group verbal and written instruction to review commonly prescribed medications for heart disease. Reviews the medication, class of the drug, and side effects. (all other drug classes)    Go Sex-Intimacy & Heart Disease, Get SMART - Goal Setting: - Group verbal and written instruction through game format to discuss heart disease and the return to sexual intimacy. Provides group verbal and written material to discuss and apply goal setting through the application of the S.M.A.R.T. Method.   Other Matters of the Heart: - Provides group verbal, written materials and models to describe Heart Failure, Angina, Valve Disease, Peripheral Artery Disease, and Diabetes in the realm of heart disease. Includes description of the disease process and treatment options available to the cardiac patient.   Exercise & Equipment Safety: - Individual verbal instruction and demonstration of equipment use and safety with use of the equipment.   Cardiac Rehab from 03/10/2017 in [facility] Cardiac and Pulmonary Rehab  Date  03/10/17  Educator  KS  Instruction Review Code  1- Verbalizes Understanding      Infection Prevention: - Provides verbal and written material to individual with discussion of infection control including proper hand washing and proper equipment cleaning during exercise session.    Cardiac Rehab from 03/10/2017 in [facility] Cardiac and Pulmonary Rehab  Date  03/10/17  Educator  KS  Instruction Review Code  1- Verbalizes Understanding      Falls Prevention: - Provides verbal and written material to individual with discussion of falls prevention and safety.   Cardiac Rehab from 03/10/2017 in [facility] Cardiac and Pulmonary Rehab  Date  03/10/17  Educator  KS  Instruction Review Code  1- Verbalizes Understanding      Diabetes: - Individual verbal and written instruction to review signs/symptoms of diabetes, desired ranges of glucose level fasting, after meals and with exercise. Acknowledge that pre and post exercise glucose checks will be done for 3 sessions at entry of program.   Other: -Provides group and verbal instruction on various topics (see comments)    Knowledge Questionnaire Score: Knowledge Questionnaire Score - 03/10/17 1210      Knowledge Questionnaire Score   Pre Score  23/28 Correct answers reviewed with patient who verbalized understanding.   Correct answers reviewed with patient who verbalized understanding.      Core Components/Risk Factors/Patient Goals at Admission: Personal Goals and Risk Factors at Admission - 03/10/17 1201      Core Components/Risk Factors/Patient Goals on Admission    Weight Management  Yes    Intervention  Weight Management: Develop a combined nutrition and exercise program designed to reach desired caloric intake, while maintaining appropriate intake of nutrient and fiber, sodium and fats, and appropriate energy expenditure required for the weight goal.;Weight Management: Provide education and appropriate resources to help participant work on and attain dietary goals.;Weight Management/Obesity: Establish reasonable short term and long term weight goals.;Obesity: Provide education and appropriate resources to help participant work on and attain dietary goals.    Admit Weight  229 lb 8 oz (104.1 kg)    Goal Weight: Short Term   220 lb (99.8 kg)    Goal Weight: Long Term  200 lb (90.7 kg)    Expected Outcomes  Short Term: Continue to assess and modify interventions until short term weight is achieved;Long Term: Adherence to nutrition and physical activity/exercise program aimed toward attainment of established weight goal;Weight Maintenance: Understanding of the daily nutrition guidelines, which includes 25-35% calories from fat, 7% or less cal from saturated fats, less than 200mg  cholesterol, less than 1.5gm of sodium, & 5 or more servings of fruits and vegetables daily;Weight Loss: Understanding of general recommendations for a balanced deficit meal plan, which promotes 1-2 lb weight loss per week and includes a negative energy balance of [phone] kcal/d;Understanding recommendations for meals to include 15-35% energy as protein, 25-35% energy from fat, 35-60% energy from carbohydrates, less than 200mg  of dietary cholesterol, 20-35 gm of total fiber daily;Understanding of distribution of calorie intake throughout the day with the consumption of 4-5 meals/snacks    Tobacco Cessation  Yes    Number of packs per day  -- does not smoke, only chews tobacco   does not smoke, only chews tobacco   Intervention  Assist the participant in steps to quit. Provide individualized education and counseling about committing to Tobacco Cessation, relapse prevention, and pharmacological support that can be provided by physician.;Education officer, environmental, assist with locating and accessing local/national Quit Smoking programs, and support quit date choice. materials offered at initial med review, patient declined intormation and is not ready to quit.    materials offered at initial med review, patient declined intormation and is not ready to quit.    Expected Outcomes  Short Term: Will demonstrate readiness to quit, by selecting a quit date.;Short Term: Will quit all tobacco product use, adhering to prevention of relapse plan.;Long Term:  Complete abstinence from all tobacco products for at least 12 months from quit date.    Hypertension  Yes    Intervention  Provide education on lifestyle modifcations including regular physical activity/exercise, weight management, moderate sodium restriction and increased consumption of fresh fruit, vegetables, and low fat dairy, alcohol moderation, and smoking cessation.;Monitor prescription use compliance.    Expected Outcomes  Short Term: Continued assessment and intervention until BP is < 140/17mm HG in hypertensive participants. < 130/31mm HG in hypertensive participants with diabetes, heart failure or chronic kidney disease.;Long Term: Maintenance of blood pressure at goal levels.    Lipids  Yes    Intervention  Provide education and support for participant on nutrition & aerobic/resistive exercise along with prescribed medications to achieve LDL 70mg , HDL >40mg .    Expected Outcomes  Short Term: Participant states understanding of desired cholesterol values and is compliant with medications prescribed. Participant is following exercise prescription and nutrition guidelines.;Long Term: Cholesterol controlled with medications as prescribed, with individualized exercise RX and with personalized nutrition plan. Value goals: LDL < 70mg , HDL > 40 mg.    Stress  Yes    Intervention  Offer individual and/or small group education and counseling on adjustment to heart disease, stress management and health-related lifestyle change. Teach and support self-help strategies.;Refer participants experiencing significant psychosocial distress to appropriate mental health specialists for further evaluation and treatment. When possible, include family members and significant others in education/counseling sessions.    Expected Outcomes  Short Term: Participant demonstrates changes in health-related behavior, relaxation and other stress management skills, ability to obtain effective social support, and compliance with  psychotropic medications if prescribed.;Long Term: Emotional wellbeing is indicated by absence of clinically significant psychosocial distress or social isolation.       Core Components/Risk Factors/Patient Goals Review:    Core Components/Risk Factors/Patient Goals at  Discharge (Final Review):    ITP Comments: ITP Comments    Row Name 03/10/17 1158 03/12/17 0638         ITP Comments  Medical Review Completed; initial ITP created. Diagnosis Documentation can be found in [facility] encounter dated 11/18/2016 and 03/06/2017.  30 day review. Continue with ITP unless directed changes per Medical Director review.  New to program         Comments:

## 2017-03-31 ENCOUNTER — Encounter: Payer: Self-pay | Admitting: *Deleted

## 2017-03-31 ENCOUNTER — Telehealth: Payer: Self-pay | Admitting: Cardiovascular Disease

## 2017-03-31 ENCOUNTER — Encounter: Payer: BLUE CROSS/BLUE SHIELD | Admitting: *Deleted

## 2017-03-31 VITALS — HR 107

## 2017-03-31 DIAGNOSIS — I214 Non-ST elevation (NSTEMI) myocardial infarction: Secondary | ICD-10-CM

## 2017-03-31 DIAGNOSIS — Z955 Presence of coronary angioplasty implant and graft: Secondary | ICD-10-CM

## 2017-03-31 NOTE — Telephone Encounter (Signed)
Received HeartTrack readings. Attempted to call Virgina Organarroll Enterkin, RN to discuss. No answer. Left message to call back.

## 2017-03-31 NOTE — Telephone Encounter (Signed)
Noralyn Pickarroll, RN brought by Cardiac Rehab Session Report for review Placed in Nurse Box

## 2017-03-31 NOTE — Progress Notes (Signed)
Daily Session Note  Patient Details  Name: Victor Ibarra. MRN: [id number] Date of Birth: [birth year] Referring Provider:     Cardiac Rehab from 03/10/2017 in [facility] Cardiac and Pulmonary Rehab  Referring Provider  Kathlyn Sacramento MD      Encounter Date: 03/31/2017  Check In: Session Check In - 03/31/17 0808      Check-In   Location  [facility]-Cardiac & Pulmonary Rehab    Staff Present  Earlean Shawl, BS, ACSM CEP, Exercise Physiologist;Joseph Hood RCP,RRT,BSRT;Carroll Enterkin, RN, Levie Heritage, MA, ACSM RCEP, Exercise Physiologist    Supervising physician immediately available to respond to emergencies  See telemetry face sheet for immediately available ER MD    Medication changes reported      No    Fall or balance concerns reported     No    Warm-up and Cool-down  Performed on first and last piece of equipment    Resistance Training Performed  Yes    VAD Patient?  No      Pain Assessment   Currently in Pain?  No/denies    Multiple Pain Sites  No          Social History   Tobacco Use  Smoking Status Former Smoker  Smokeless Tobacco Current User  . Types: Chew  Tobacco Comment   smoked some in high school - none since. Chews tobacco, offered cessation materials, patient declined stating he is not ready to quit chewing tobacco at this time since it is his livelyhood.     Goals Met:  Exercise tolerated well Personal goals reviewed No report of cardiac concerns or symptoms Strength training completed today  Goals Unmet:  Not Applicable  Comments: First full day of exercise!  Patient was oriented to gym and equipment including functions, settings, policies, and procedures.  Patient's individual exercise prescription and treatment plan were reviewed.  All starting workloads were established based on the results of the 6 minute walk test done at initial orientation visit.  The plan for exercise progression was also introduced and progression will be  customized based on patient's performance and goals.    Dr. Emily Filbert is Medical Director for [place] and [facility].

## 2017-03-31 NOTE — Telephone Encounter (Signed)
Strips showed to Dr Kirke CorinArida. He reviewed and feels they are artifact. No new orders.

## 2017-03-31 NOTE — Telephone Encounter (Signed)
Carroll from rehab calling back to let jennifer know she sent over strips due to scanning taking 2-3 months   She states patient is fine

## 2017-04-02 DIAGNOSIS — I214 Non-ST elevation (NSTEMI) myocardial infarction: Secondary | ICD-10-CM

## 2017-04-02 NOTE — Progress Notes (Signed)
Daily Session Note  Patient Details  Name: Victor Ibarra. MRN: [id number] Date of Birth: February 20, 1976 Referring Provider:     Cardiac Rehab from 03/10/2017 in [facility] Cardiac and Pulmonary Rehab  Referring Provider  Kathlyn Sacramento MD      Encounter Date: 04/02/2017  Check In: Session Check In - 04/02/17 0752      Check-In   Location  [facility]-Cardiac & Pulmonary Rehab    Staff Present  Justin Mend RCP,RRT,BSRT;Heath Lark, RN, BSN, CCRP;Jessica Luan Pulling, MA, ACSM RCEP, Exercise Physiologist    Supervising physician immediately available to respond to emergencies  See telemetry face sheet for immediately available ER MD    Medication changes reported      No    Fall or balance concerns reported     No    Warm-up and Cool-down  Performed on first and last piece of equipment    Resistance Training Performed  Yes    VAD Patient?  No      Pain Assessment   Currently in Pain?  No/denies          Social History   Tobacco Use  Smoking Status Former Smoker  Smokeless Tobacco Architectural technologist  . Types: Chew  Tobacco Comment   smoked some in high school - none since. Chews tobacco, offered cessation materials, patient declined stating he is not ready to quit chewing tobacco at this time since it is his livelyhood.     Goals Met:  Independence with exercise equipment Exercise tolerated well No report of cardiac concerns or symptoms Strength training completed today  Goals Unmet:  Not Applicable  Comments: .Pt able to follow exercise prescription today without complaint.  Will continue to monitor for progression.   Dr. Emily Filbert is Medical Director for [place] and [facility].

## 2017-04-04 DIAGNOSIS — I214 Non-ST elevation (NSTEMI) myocardial infarction: Secondary | ICD-10-CM

## 2017-04-04 DIAGNOSIS — Z955 Presence of coronary angioplasty implant and graft: Secondary | ICD-10-CM

## 2017-04-04 NOTE — Progress Notes (Signed)
Daily Session Note  Patient Details  Name: Victor Ibarra. MRN: [id number] Date of Birth: Oct 14, 1975 Referring Provider:     Cardiac Rehab from 03/10/2017 in [facility] Cardiac and Pulmonary Rehab  Referring Provider  Kathlyn Sacramento MD      Encounter Date: 04/04/2017  Check In: Session Check In - 04/04/17 1021      Check-In   Location  [facility]-Cardiac & Pulmonary Rehab    Staff Present  Nada Maclachlan, BA, ACSM CEP, Exercise Physiologist;Carroll Enterkin, RN, Levie Heritage, MA, ACSM RCEP, Exercise Physiologist    Supervising physician immediately available to respond to emergencies  See telemetry face sheet for immediately available ER MD    Medication changes reported      No    Fall or balance concerns reported     No    Warm-up and Cool-down  Performed on first and last piece of equipment    Resistance Training Performed  Yes    VAD Patient?  No      Pain Assessment   Currently in Pain?  No/denies        Exercise Prescription Changes - 04/03/17 1300      Response to Exercise   Blood Pressure (Admit)  132/60    Blood Pressure (Exercise)  142/76    Blood Pressure (Exit)  122/76    Heart Rate (Admit)  67 bpm    Heart Rate (Exercise)  126 bpm    Heart Rate (Exit)  78 bpm    Rating of Perceived Exertion (Exercise)  13    Symptoms  none    Comments  second full day of exercise    Duration  Progress to 45 minutes of aerobic exercise without signs/symptoms of physical distress    Intensity  THRR unchanged      Progression   Progression  Continue to progress workloads to maintain intensity without signs/symptoms of physical distress.    Average METs  3.19      Resistance Training   Training Prescription  Yes    Weight  5 lbs    Reps  10-15      Interval Training   Interval Training  No      Treadmill   MPH  3.5    Grade  0    Minutes  15    METs  3.68      Elliptical   Level  1    Speed  2.7    Minutes  15      T5 Nustep   Level  4    Minutes   15    METs  2.7       Social History   Tobacco Use  Smoking Status Former Smoker  Smokeless Tobacco Current User  . Types: Chew  Tobacco Comment   smoked some in high school - none since. Chews tobacco, offered cessation materials, patient declined stating he is not ready to quit chewing tobacco at this time since it is his livelyhood.     Goals Met:  Independence with exercise equipment Exercise tolerated well No report of cardiac concerns or symptoms Strength training completed today  Goals Unmet:  Not Applicable  Comments: Pt able to follow exercise prescription today without complaint.  Will continue to monitor for progression. Reviewed home exercise with pt today.  Pt plans to walk and use treadmill at home for exercise.  Reviewed THR, pulse, RPE, sign and symptoms, NTG use, and when to call 911 or MD.  Also discussed  weather considerations and indoor options.  Pt voiced understanding.    Dr. Emily Filbert is Medical Director for [place] and [facility].

## 2017-04-07 ENCOUNTER — Encounter: Payer: BLUE CROSS/BLUE SHIELD | Attending: Cardiovascular Disease | Admitting: *Deleted

## 2017-04-07 DIAGNOSIS — I214 Non-ST elevation (NSTEMI) myocardial infarction: Secondary | ICD-10-CM

## 2017-04-07 DIAGNOSIS — Z955 Presence of coronary angioplasty implant and graft: Secondary | ICD-10-CM

## 2017-04-07 NOTE — Progress Notes (Signed)
Daily Session Note  Patient Details  Name: Victor Ibarra. MRN: [id number] Date of Birth: 1975/05/23 Referring Provider:     Cardiac Rehab from 03/10/2017 in [facility] Cardiac and Pulmonary Rehab  Referring Provider  Kathlyn Sacramento MD      Encounter Date: 04/07/2017  Check In: Session Check In - 04/07/17 0810      Check-In   Location  [facility]-Cardiac & Pulmonary Rehab    Staff Present  Earlean Shawl, BS, ACSM CEP, Exercise Physiologist;Susanne Bice, RN, BSN, CCRP;Iverna Hammac Luan Pulling, MA, ACSM RCEP, Exercise Physiologist    Supervising physician immediately available to respond to emergencies  See telemetry face sheet for immediately available ER MD    Medication changes reported      No    Fall or balance concerns reported     No    Warm-up and Cool-down  Performed on first and last piece of equipment    Resistance Training Performed  Yes    VAD Patient?  No      Pain Assessment   Currently in Pain?  No/denies          Social History   Tobacco Use  Smoking Status Former Smoker  Smokeless Tobacco Architectural technologist  . Types: Chew  Tobacco Comment   smoked some in high school - none since. Chews tobacco, offered cessation materials, patient declined stating he is not ready to quit chewing tobacco at this time since it is his livelyhood.     Goals Met:  Independence with exercise equipment Exercise tolerated well No report of cardiac concerns or symptoms Strength training completed today  Goals Unmet:  Not Applicable  Comments: Pt able to follow exercise prescription today without complaint.  Will continue to monitor for progression.    Dr. Emily Filbert is Medical Director for [place] and [facility].

## 2017-04-09 ENCOUNTER — Encounter: Payer: BLUE CROSS/BLUE SHIELD | Admitting: *Deleted

## 2017-04-09 ENCOUNTER — Encounter: Payer: Self-pay | Admitting: *Deleted

## 2017-04-09 DIAGNOSIS — Z955 Presence of coronary angioplasty implant and graft: Secondary | ICD-10-CM

## 2017-04-09 DIAGNOSIS — I214 Non-ST elevation (NSTEMI) myocardial infarction: Secondary | ICD-10-CM | POA: Diagnosis not present

## 2017-04-09 NOTE — Progress Notes (Signed)
Daily Session Note  Patient Details  Name: Victor Ibarra. MRN: [id number] Date of Birth: June 23, 1975 Referring Provider:     Cardiac Rehab from 03/10/2017 in [facility] Cardiac and Pulmonary Rehab  Referring Provider  Kathlyn Sacramento MD      Encounter Date: 04/09/2017  Check In: Session Check In - 04/09/17 1627      Check-In   Location  [facility]-Cardiac & Pulmonary Rehab    Staff Present  Renita Papa, RN Vickki Hearing, BA, ACSM CEP, Exercise Physiologist;Carroll Enterkin, RN, BSN    Supervising physician immediately available to respond to emergencies  See telemetry face sheet for immediately available ER MD    Medication changes reported      No    Fall or balance concerns reported     No    Warm-up and Cool-down  Performed on first and last piece of equipment    Resistance Training Performed  Yes    VAD Patient?  No      Pain Assessment   Currently in Pain?  No/denies          Social History   Tobacco Use  Smoking Status Former Smoker  Smokeless Tobacco Architectural technologist  . Types: Chew  Tobacco Comment   smoked some in high school - none since. Chews tobacco, offered cessation materials, patient declined stating he is not ready to quit chewing tobacco at this time since it is his livelyhood.     Goals Met:  Independence with exercise equipment Exercise tolerated well No report of cardiac concerns or symptoms Strength training completed today  Goals Unmet:  Not Applicable  Comments: Pt able to follow exercise prescription today without complaint.  Will continue to monitor for progression.    Dr. Emily Filbert is Medical Director for [place] and [facility].

## 2017-04-09 NOTE — Progress Notes (Signed)
Cardiac Individual Treatment Plan  Patient Details  Name: Victor Ibarra. MRN: [id number] Date of Birth: Sep 28, 1975 Referring Provider:     Cardiac Rehab from 03/10/2017 in [facility] Rehab  Referring Provider  Kathlyn Sacramento MD      Initial Encounter Date:    Cardiac Rehab from 03/10/2017 in [facility] Cardiac and Pulmonary Rehab  Date  03/10/17  Referring Provider  Kathlyn Sacramento MD      Visit Diagnosis: NSTEMI (non-ST elevated myocardial infarction) [facility])  Status post coronary artery stent placement  Patient's Home Medications on Admission:  Current Outpatient Medications:  .  aspirin 81 MG chewable tablet, Chew 1 tablet (81 mg total) by mouth daily., Disp: 30 tablet, Rfl: 2 .  atorvastatin (LIPITOR) 80 MG tablet, Take 1 tablet (80 mg total) by mouth daily at 6 PM., Disp: 90 tablet, Rfl: 3 .  metoprolol succinate (TOPROL XL) 25 MG 24 hr tablet, Take 1 tablet (25 mg total) by mouth daily., Disp: 90 tablet, Rfl: 3 .  nitroGLYCERIN (NITROSTAT) 0.4 MG SL tablet, Place 1 tablet (0.4 mg total) under the tongue every 5 (five) minutes as needed for chest pain., Disp: 30 tablet, Rfl: 2 .  ticagrelor (BRILINTA) 90 MG TABS tablet, Take 1 tablet (90 mg total) by mouth 2 (two) times daily., Disp: 180 tablet, Rfl: 3  Past Medical History: Past Medical History:  Diagnosis Date  . CAD (coronary artery disease)    a. 11/2016 NSTEMI/PCI: LAD 60p w/ bridge.  FFR 0.81-->PCI performed 2/2 ACS/presumed plaque rupture (2.75x18 Xience Alpine DES). OTW nl cors, EF 55-65%.  . MVP (mitral valve prolapse)    a. 11/2014 Echo: EF 55-60%, mild LVh, inferolateral HK, mild MVP, mild MR, nl RV fxn.  . Obesity   . [facility] spotted fever    a. Dx 09/2016 - s/p 2 wks of doxycycline.  . Tick bite     Tobacco Use: Social History   Tobacco Use  Smoking Status Former Smoker  Smokeless Tobacco Current User  . Types: Chew  Tobacco Comment   smoked some in high school - none  since. Chews tobacco, offered cessation materials, patient declined stating he is not ready to quit chewing tobacco at this time since it is his livelyhood.     Labs: Recent Review Flowsheet Data    Labs for ITP Cardiac and Pulmonary Rehab Latest Ref Rng & Units 11/18/2016 11/19/2016 12/24/2016   Cholestrol 0 - 200 mg/dL 157 - 79   LDLCALC 0 - 99 mg/dL 93 - 32   HDL >40 mg/dL 37(L) - 36(L)   Trlycerides <150 mg/dL 137 - 56   Hemoglobin A1c 4.8 - 5.6 % - 5.2 -       Exercise Target Goals:    Exercise Program Goal: Individual exercise prescription set with THRR, safety & activity barriers. Participant demonstrates ability to understand and report RPE using BORG scale, to self-measure pulse accurately, and to acknowledge the importance of the exercise prescription.  Exercise Prescription Goal: Starting with aerobic activity 30 plus minutes a day, 3 days per week for initial exercise prescription. Provide home exercise prescription and guidelines that participant acknowledges understanding prior to discharge.  Activity Barriers & Risk Stratification: Activity Barriers & Cardiac Risk Stratification - 03/10/17 1209      Activity Barriers & Cardiac Risk Stratification   Activity Barriers  Back Problems sees a chiropractor approx once a month    Cardiac Risk Stratification  Moderate  6 Minute Walk: 6 Minute Walk    Row Name 03/10/17 1131         6 Minute Walk   Phase  Initial     Distance  1888 feet     Walk Time  6 minutes     # of Rest Breaks  0     MPH  3.58     METS  5.55     RPE  11     VO2 Peak  19.43     Symptoms  No     Resting HR  63 bpm     Resting BP  134/64     Resting Oxygen Saturation   99 %     Exercise Oxygen Saturation  during 6 min walk  99 %     Max Ex. HR  98 bpm     Max Ex. BP  154/70     2 Minute Post BP  120/60        Oxygen Initial Assessment:   Oxygen Re-Evaluation:   Oxygen Discharge (Final Oxygen Re-Evaluation):   Initial  Exercise Prescription: Initial Exercise Prescription - 03/10/17 1100      Date of Initial Exercise RX and Referring Provider   Date  03/10/17    Referring Provider  Kathlyn Sacramento MD      Treadmill   MPH  3.5    Grade  0    Minutes  15    METs  3.68      Elliptical   Level  2    Speed  2.7    Minutes  15      T5 Nustep   Level  4    SPM  100    Minutes  15    METs  3      Prescription Details   Frequency (times per week)  3    Duration  Progress to 45 minutes of aerobic exercise without signs/symptoms of physical distress      Intensity   THRR 40-80% of Max Heartrate  109-155    Ratings of Perceived Exertion  11-13    Perceived Dyspnea  0-4      Progression   Progression  Continue to progress workloads to maintain intensity without signs/symptoms of physical distress.      Resistance Training   Training Prescription  Yes    Weight  5 lbs    Reps  10-15       Perform Capillary Blood Glucose checks as needed.  Exercise Prescription Changes: Exercise Prescription Changes    Row Name 03/10/17 1100 04/03/17 1300           Response to Exercise   Blood Pressure (Admit)  134/64  132/60      Blood Pressure (Exercise)  154/70  142/76      Blood Pressure (Exit)  120/60  122/76      Heart Rate (Admit)  63 bpm  67 bpm      Heart Rate (Exercise)  98 bpm  126 bpm      Heart Rate (Exit)  70 bpm  78 bpm      Oxygen Saturation (Admit)  99 %  -      Oxygen Saturation (Exercise)  99 %  -      Rating of Perceived Exertion (Exercise)  11  13      Symptoms  none  none      Comments  walk test results  second full day of exercise  Duration  -  Progress to 45 minutes of aerobic exercise without signs/symptoms of physical distress      Intensity  -  THRR unchanged        Progression   Progression  -  Continue to progress workloads to maintain intensity without signs/symptoms of physical distress.      Average METs  -  3.19        Resistance Training   Training  Prescription  -  Yes      Weight  -  5 lbs      Reps  -  10-15        Interval Training   Interval Training  -  No        Treadmill   MPH  -  3.5      Grade  -  0      Minutes  -  15      METs  -  3.68        Elliptical   Level  -  1      Speed  -  2.7      Minutes  -  15        T5 Nustep   Level  -  4      Minutes  -  15      METs  -  2.7         Exercise Comments: Exercise Comments    Row Name 03/31/17 [id number]           Exercise Comments  First full day of exercise!  Patient was oriented to gym and equipment including functions, settings, policies, and procedures.  Patient's individual exercise prescription and treatment plan were reviewed.  All starting workloads were established based on the results of the 6 minute walk test done at initial orientation visit.  The plan for exercise progression was also introduced and progression will be customized based on patient's performance and goals          Exercise Goals and Review: Exercise Goals    Row Name 03/10/17 1140             Exercise Goals   Increase Physical Activity  Yes       Intervention  Provide advice, education, support and counseling about physical activity/exercise needs.;Develop an individualized exercise prescription for aerobic and resistive training based on initial evaluation findings, risk stratification, comorbidities and participant's personal goals.       Expected Outcomes  Achievement of increased cardiorespiratory fitness and enhanced flexibility, muscular endurance and strength shown through measurements of functional capacity and personal statement of participant.       Increase Strength and Stamina  Yes       Intervention  Develop an individualized exercise prescription for aerobic and resistive training based on initial evaluation findings, risk stratification, comorbidities and participant's personal goals.;Provide advice, education, support and counseling about physical activity/exercise  needs.       Expected Outcomes  Achievement of increased cardiorespiratory fitness and enhanced flexibility, muscular endurance and strength shown through measurements of functional capacity and personal statement of participant.       Able to understand and use rate of perceived exertion (RPE) scale  Yes       Intervention  Provide education and explanation on how to use RPE scale       Expected Outcomes  Short Term: Able to use RPE daily in rehab to express subjective intensity level;Long Term:  Able to  use RPE to guide intensity level when exercising independently       Knowledge and understanding of Target Heart Rate Range (THRR)  Yes       Intervention  Provide education and explanation of THRR including how the numbers were predicted and where they are located for reference       Expected Outcomes  Short Term: Able to state/look up THRR;Long Term: Able to use THRR to govern intensity when exercising independently;Short Term: Able to use daily as guideline for intensity in rehab       Able to check pulse independently  Yes       Intervention  Provide education and demonstration on how to check pulse in carotid and radial arteries.;Review the importance of being able to check your own pulse for safety during independent exercise       Expected Outcomes  Short Term: Able to explain why pulse checking is important during independent exercise;Long Term: Able to check pulse independently and accurately       Understanding of Exercise Prescription  Yes       Intervention  Provide education, explanation, and written materials on patient's individual exercise prescription       Expected Outcomes  Short Term: Able to explain program exercise prescription;Long Term: Able to explain home exercise prescription to exercise independently          Exercise Goals Re-Evaluation : Exercise Goals Re-Evaluation    Row Name 04/03/17 1348 04/04/17 1024           Exercise Goal Re-Evaluation   Exercise Goals  Review  Increase Physical Activity;Increase Strength and Stamina;Understanding of Exercise Prescription  Understanding of Exercise Prescription;Knowledge and understanding of Target Heart Rate Range (THRR);Able to understand and use rate of perceived exertion (RPE) scale;Able to check pulse independently      Comments  Herbie Baltimore is off to a good start in rehab.  He has completed two full days of exercise.  He is already up to level 4 on the NuStep and using 5 lbs weights.  We will continue to monitor his progression.   Reviewed home exercise with pt today.  Pt plans to walk and use treadmill at home for exercise.  Reviewed THR, pulse, RPE, sign and symptoms, NTG use, and when to call 911 or MD.  Also discussed weather considerations and indoor options.  Pt voiced understanding.      Expected Outcomes  Short: Continue to attend rehab regularly.  Long: Work on increasing physical activity.    Short: Continue to use treadmill at home and increase speed here to match what he is already doing at home.  Long: Continue to exercise regularly.          Discharge Exercise Prescription (Final Exercise Prescription Changes): Exercise Prescription Changes - 04/03/17 1300      Response to Exercise   Blood Pressure (Admit)  132/60    Blood Pressure (Exercise)  142/76    Blood Pressure (Exit)  122/76    Heart Rate (Admit)  67 bpm    Heart Rate (Exercise)  126 bpm    Heart Rate (Exit)  78 bpm    Rating of Perceived Exertion (Exercise)  13    Symptoms  none    Comments  second full day of exercise    Duration  Progress to 45 minutes of aerobic exercise without signs/symptoms of physical distress    Intensity  THRR unchanged      Progression   Progression  Continue to  progress workloads to maintain intensity without signs/symptoms of physical distress.    Average METs  3.19      Resistance Training   Training Prescription  Yes    Weight  5 lbs    Reps  10-15      Interval Training   Interval Training  No       Treadmill   MPH  3.5    Grade  0    Minutes  15    METs  3.68      Elliptical   Level  1    Speed  2.7    Minutes  15      T5 Nustep   Level  4    Minutes  15    METs  2.7       Nutrition:  Target Goals: Understanding of nutrition guidelines, daily intake of sodium <1557m, cholesterol <2078m calories 30% from fat and 7% or less from saturated fats, daily to have 5 or more servings of fruits and vegetables.  Biometrics: Pre Biometrics - 03/10/17 1140      Pre Biometrics   Height  6' 1.8" (1.875 m)    Weight  229 lb 8 oz (104.1 kg)    Waist Circumference  36 inches    Hip Circumference  43 inches    Waist to Hip Ratio  0.84 %    BMI (Calculated)  29.61    Single Leg Stand  30 seconds        Nutrition Therapy Plan and Nutrition Goals: Nutrition Therapy & Goals - 03/10/17 1206      Intervention Plan   Intervention  Prescribe, educate and counsel regarding individualized specific dietary modifications aiming towards targeted core components such as weight, hypertension, lipid management, diabetes, heart failure and other comorbidities.;Nutrition handout(s) given to patient.    Expected Outcomes  Short Term Goal: A plan has been developed with personal nutrition goals set during dietitian appointment.;Long Term Goal: Adherence to prescribed nutrition plan.;Short Term Goal: Understand basic principles of dietary content, such as calories, fat, sodium, cholesterol and nutrients.       Nutrition Discharge: Rate Your Plate Scores: Nutrition Assessments - 03/10/17 1206      MEDFICTS Scores   Pre Score  26       Nutrition Goals Re-Evaluation:   Nutrition Goals Discharge (Final Nutrition Goals Re-Evaluation):   Psychosocial: Target Goals: Acknowledge presence or absence of significant depression and/or stress, maximize coping skills, provide positive support system. Participant is able to verbalize types and ability to use techniques and skills needed for  reducing stress and depression.   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       Family Dynamics   Good Support System?  Yes wife and extended family      Barriers   Psychosocial barriers to participate in program  The patient should benefit from training in stress management and relaxation.      Screening Interventions   Interventions  Yes    Expected Outcomes  Short Term goal: Utilizing psychosocial counselor, staff and physician to assist  with identification of specific Stressors or current issues interfering with healing process. Setting desired goal for each stressor or current issue identified.;Long Term Goal: Stressors or current issues are controlled or eliminated.;Long Term goal: The participant improves quality of Life and PHQ9 Scores as seen by post scores and/or verbalization of changes       Quality of Life Scores:  Quality of Life - 03/10/17 1141      Quality of Life Scores   Health/Function Pre  28.43 %    Socioeconomic Pre  25.83 %    Psych/Spiritual Pre  29.17 %    Family Pre  23.88 %    GLOBAL Pre  27.48 %       PHQ-9: Recent Review Flowsheet Data    Depression screen [facility] 2/9 03/10/2017   Decreased Interest 0   Down, Depressed, Hopeless 0   PHQ - 2 Score 0   Altered sleeping 0   Tired, decreased energy 0   Change in appetite 0   Feeling bad or failure about yourself  0   Trouble concentrating 0   Moving slowly or fidgety/restless 0   Suicidal thoughts 0   PHQ-9 Score 0   Difficult doing work/chores Not difficult at all     Interpretation of Total Score  Total Score Depression Severity:  1-4 = Minimal  depression, 5-9 = Mild depression, 10-14 = Moderate depression, 15-19 = Moderately severe depression, 20-27 = Severe depression   Psychosocial Evaluation and Intervention: Psychosocial Evaluation - 03/31/17 0954      Psychosocial Evaluation & Interventions   Interventions  Encouraged to exercise with the program and follow exercise prescription;Stress management education    Comments  Counselor met with Mr. Eckert Herbie Baltimore) today for initial psychosocial evaluation.   He is a 41 year old who had a heart attack this past summer and had a stent inserted.   He has a strong support system with a spouse of 74 years; a sister locally and a nephew  who works with him.  He reports sleeping well and having a good appetite.  He denies a history of depression or anxiety or any current symptoms.  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  He has goals to learn more about his condition and lose some more weight while in this program.  Staff will follow with Herbie Baltimore throughout the course of this program.     Expected Outcomes  Herbie Baltimore will benefit from consistent exercise to achieve his stated goals.  The educational and psychoeducational components of this program will educate him on his condition and help him cope more positively.  He will meet with the dietician to address his weight loss goals.    Continue Psychosocial Services   Follow up required by staff       Psychosocial Re-Evaluation:   Psychosocial Discharge (Final Psychosocial Re-Evaluation):   Vocational Rehabilitation: Provide vocational rehab assistance to qualifying candidates.   Vocational Rehab Evaluation & Intervention: Vocational Rehab - 03/10/17 1211      Initial Vocational Rehab Evaluation & Intervention   Assessment shows need for Vocational Rehabilitation  No       Education: Education Goals: Education classes will be provided on  a variety of topics geared toward better understanding of heart health and risk factor modification. Participant will state understanding/return demonstration of topics presented as noted by education test scores.  Learning Barriers/Preferences: Learning  Barriers/Preferences - 03/10/17 1210      Learning Barriers/Preferences   Learning Barriers  None    Learning Preferences  None       Education Topics: General Nutrition Guidelines/Fats and Fiber: -Group instruction provided by verbal, written material, models and posters to present the general guidelines for heart healthy nutrition. Gives an explanation and review of dietary fats and fiber.   Controlling Sodium/Reading Food Labels: -Group verbal and written material supporting the discussion of sodium use in heart healthy nutrition. Review and explanation with models, verbal and written materials for utilization of the food label.   Exercise Physiology & Risk Factors: - Group verbal and written instruction with models to review the exercise physiology of the cardiovascular system and associated critical values. Details cardiovascular disease risk factors and the goals associated with each risk factor.   Aerobic Exercise & Resistance Training: - Gives group verbal and written discussion on the health impact of inactivity. On the components of aerobic and resistive training programs and the benefits of this training and how to safely progress through these programs.   Flexibility, Balance, General Exercise Guidelines: - Provides group verbal and written instruction on the benefits of flexibility and balance training programs. Provides general exercise guidelines with specific guidelines to those with heart or lung disease. Demonstration and skill practice provided.   Stress Management: - Provides group verbal and written instruction about the health risks of elevated stress, cause of high stress, and healthy ways to reduce  stress.   Depression: - Provides group verbal and written instruction on the correlation between heart/lung disease and depressed mood, treatment options, and the stigmas associated with seeking treatment.   Anatomy & Physiology of the Heart: - Group verbal and written instruction and models provide basic cardiac anatomy and physiology, with the coronary electrical and arterial systems. Review of: AMI, Angina, Valve disease, Heart Failure, Cardiac Arrhythmia, Pacemakers, and the ICD.   Cardiac Procedures: - Group verbal and written instruction to review commonly prescribed medications for heart disease. Reviews the medication, class of the drug, and side effects. Includes the steps to properly store meds and maintain the prescription regimen. (beta blockers and nitrates)   Cardiac Medications I: - Group verbal and written instruction to review commonly prescribed medications for heart disease. Reviews the medication, class of the drug, and side effects. Includes the steps to properly store meds and maintain the prescription regimen.   Cardiac Rehab from 04/07/2017 in [facility] Cardiac and Pulmonary Rehab  Date  03/31/17  Educator  CE  Instruction Review Code  1- Verbalizes Understanding      Cardiac Medications II: -Group verbal and written instruction to review commonly prescribed medications for heart disease. Reviews the medication, class of the drug, and side effects. (all other drug classes)    Go Sex-Intimacy & Heart Disease, Get SMART - Goal Setting: - Group verbal and written instruction through game format to discuss heart disease and the return to sexual intimacy. Provides group verbal and written material to discuss and apply goal setting through the application of the S.M.A.R.T. Method.   Other Matters of the Heart: - Provides group verbal, written materials and models to describe Heart Failure, Angina, Valve Disease, Peripheral Artery Disease, and Diabetes in the realm of  heart disease. Includes description of the disease process and treatment options available to the cardiac patient.   Exercise & Equipment Safety: - Individual verbal instruction and demonstration of equipment use and safety with use of the equipment.   Cardiac  Rehab from 04/07/2017 in [facility] Cardiac and Pulmonary Rehab  Date  03/10/17  Educator  KS  Instruction Review Code  1- Verbalizes Understanding      Infection Prevention: - Provides verbal and written material to individual with discussion of infection control including proper hand washing and proper equipment cleaning during exercise session.   Cardiac Rehab from 04/07/2017 in [facility] Cardiac and Pulmonary Rehab  Date  03/10/17  Educator  KS  Instruction Review Code  1- Verbalizes Understanding      Falls Prevention: - Provides verbal and written material to individual with discussion of falls prevention and safety.   Cardiac Rehab from 04/07/2017 in [facility] Cardiac and Pulmonary Rehab  Date  03/10/17  Educator  KS  Instruction Review Code  1- Verbalizes Understanding      Diabetes: - Individual verbal and written instruction to review signs/symptoms of diabetes, desired ranges of glucose level fasting, after meals and with exercise. Acknowledge that pre and post exercise glucose checks will be done for 3 sessions at entry of program.   Other: -Provides group and verbal instruction on various topics (see comments)    Knowledge Questionnaire Score: Knowledge Questionnaire Score - 03/10/17 1210      Knowledge Questionnaire Score   Pre Score  23/28 Correct answers reviewed with patient who verbalized understanding.       Core Components/Risk Factors/Patient Goals at Admission: Personal Goals and Risk Factors at Admission - 03/10/17 1201      Core Components/Risk Factors/Patient Goals on Admission    Weight Management  Yes    Intervention  Weight Management: Develop a combined nutrition and exercise program designed to  reach desired caloric intake, while maintaining appropriate intake of nutrient and fiber, sodium and fats, and appropriate energy expenditure required for the weight goal.;Weight Management: Provide education and appropriate resources to help participant work on and attain dietary goals.;Weight Management/Obesity: Establish reasonable short term and long term weight goals.;Obesity: Provide education and appropriate resources to help participant work on and attain dietary goals.    Admit Weight  229 lb 8 oz (104.1 kg)    Goal Weight: Short Term  220 lb (99.8 kg)    Goal Weight: Long Term  200 lb (90.7 kg)    Expected Outcomes  Short Term: Continue to assess and modify interventions until short term weight is achieved;Long Term: Adherence to nutrition and physical activity/exercise program aimed toward attainment of established weight goal;Weight Maintenance: Understanding of the daily nutrition guidelines, which includes 25-35% calories from fat, 7% or less cal from saturated fats, less than 234m cholesterol, less than 1.5gm of sodium, & 5 or more servings of fruits and vegetables daily;Weight Loss: Understanding of general recommendations for a balanced deficit meal plan, which promotes 1-2 lb weight loss per week and includes a negative energy balance of [phone] kcal/d;Understanding recommendations for meals to include 15-35% energy as protein, 25-35% energy from fat, 35-60% energy from carbohydrates, less than 2075mof dietary cholesterol, 20-35 gm of total fiber daily;Understanding of distribution of calorie intake throughout the day with the consumption of 4-5 meals/snacks    Tobacco Cessation  Yes    Number of packs per day  -- does not smoke, only chews tobacco    Intervention  Assist the participant in steps to quit. Provide individualized education and counseling about committing to Tobacco Cessation, relapse prevention, and pharmacological support that can be provided by physician.;OfShip brokerassist with locating and accessing local/national Quit Smoking programs, and support quit date  choice. materials offered at initial med review, patient declined intormation and is not ready to quit.     Expected Outcomes  Short Term: Will demonstrate readiness to quit, by selecting a quit date.;Short Term: Will quit all tobacco product use, adhering to prevention of relapse plan.;Long Term: Complete abstinence from all tobacco products for at least 12 months from quit date.    Hypertension  Yes    Intervention  Provide education on lifestyle modifcations including regular physical activity/exercise, weight management, moderate sodium restriction and increased consumption of fresh fruit, vegetables, and low fat dairy, alcohol moderation, and smoking cessation.;Monitor prescription use compliance.    Expected Outcomes  Short Term: Continued assessment and intervention until BP is < 140/62m HG in hypertensive participants. < 130/852mHG in hypertensive participants with diabetes, heart failure or chronic kidney disease.;Long Term: Maintenance of blood pressure at goal levels.    Lipids  Yes    Intervention  Provide education and support for participant on nutrition & aerobic/resistive exercise along with prescribed medications to achieve LDL <7079mHDL >30m50m  Expected Outcomes  Short Term: Participant states understanding of desired cholesterol values and is compliant with medications prescribed. Participant is following exercise prescription and nutrition guidelines.;Long Term: Cholesterol controlled with medications as prescribed, with individualized exercise RX and with personalized nutrition plan. Value goals: LDL < 70mg7mL > 40 mg.    Stress  Yes    Intervention  Offer individual and/or small group education and counseling on adjustment to heart disease, stress management and health-related lifestyle change. Teach and support self-help strategies.;Refer participants  experiencing significant psychosocial distress to appropriate mental health specialists for further evaluation and treatment. When possible, include family members and significant others in education/counseling sessions.    Expected Outcomes  Short Term: Participant demonstrates changes in health-related behavior, relaxation and other stress management skills, ability to obtain effective social support, and compliance with psychotropic medications if prescribed.;Long Term: Emotional wellbeing is indicated by absence of clinically significant psychosocial distress or social isolation.       Core Components/Risk Factors/Patient Goals Review:    Core Components/Risk Factors/Patient Goals at Discharge (Final Review):    ITP Comments: ITP Comments    Row Name 03/10/17 1158 03/12/17 0638 03/31/17 1038 04/09/17 0556     ITP Comments  Medical Review Completed; initial ITP created. [facility] 11/18/2016 and 03/06/2017.  30 day review. Continue with ITP unless directed changes per Medical Director review.  New to program  Aflutter during Cardiac Rehab exercise today when heart rate 104. Copy taken to Dr. AridaTyrell Antonioce after Cardiac REhab. Will cont to monitor when he is in Cardiac Rehab.   30 day review. Continue with ITP unless directed changes per Medical Director review.        Comments:

## 2017-04-11 DIAGNOSIS — I214 Non-ST elevation (NSTEMI) myocardial infarction: Secondary | ICD-10-CM | POA: Diagnosis not present

## 2017-04-11 DIAGNOSIS — Z955 Presence of coronary angioplasty implant and graft: Secondary | ICD-10-CM

## 2017-04-11 NOTE — Progress Notes (Signed)
Daily Session Note  Patient Details  Name: Victor Ibarra. MRN: [id number] Date of Birth: [birth year] Referring Provider:     Cardiac Rehab from 03/10/2017 in [facility] Cardiac and Pulmonary Rehab  Referring Provider  Kathlyn Sacramento MD      Encounter Date: 04/11/2017  Check In: Session Check In - 04/11/17 0927      Check-In   Location  [facility]-Cardiac & Pulmonary Rehab    Staff Present  Darel Hong, RN BSN;Jessica Luan Pulling, MA, ACSM RCEP, Exercise Physiologist;Merrick Maggio Oletta Darter, [place], ACSM CEP, Exercise Physiologist    Supervising physician immediately available to respond to emergencies  See telemetry face sheet for immediately available ER MD    Medication changes reported      No    Fall or balance concerns reported     No    Warm-up and Cool-down  Performed on first and last piece of equipment    Resistance Training Performed  Yes    VAD Patient?  No      Pain Assessment   Currently in Pain?  No/denies    Multiple Pain Sites  No          Social History   Tobacco Use  Smoking Status Former Smoker  Smokeless Tobacco Current User  . Types: Chew  Tobacco Comment   smoked some in high school - none since. Chews tobacco, offered cessation materials, patient declined stating he is not ready to quit chewing tobacco at this time since it is his livelyhood.     Goals Met:  Independence with exercise equipment Exercise tolerated well No report of cardiac concerns or symptoms Strength training completed today  Goals Unmet:  Not Applicable  Comments: Pt able to follow exercise prescription today without complaint.  Will continue to monitor for progression.    Dr. Emily Filbert is Medical Director for [place] and [facility].

## 2017-04-16 DIAGNOSIS — I214 Non-ST elevation (NSTEMI) myocardial infarction: Secondary | ICD-10-CM | POA: Diagnosis not present

## 2017-04-16 NOTE — Progress Notes (Signed)
Daily Session Note  Patient Details  Name: Victor Ibarra. MRN: [id number] Date of Birth: 06-04-1975 Referring Provider:     Cardiac Rehab from 03/10/2017 in [facility] Cardiac and Pulmonary Rehab  Referring Provider  Kathlyn Sacramento MD      Encounter Date: 04/16/2017  Check In: Session Check In - 04/16/17 0717      Check-In   Location  [facility]-Cardiac & Pulmonary Rehab    Staff Present  Justin Mend RCP,RRT,BSRT;Heath Lark, RN, BSN, CCRP;Jessica Luan Pulling, MA, ACSM RCEP, Exercise Physiologist    Supervising physician immediately available to respond to emergencies  See telemetry face sheet for immediately available ER MD    Medication changes reported      No    Fall or balance concerns reported     No    Warm-up and Cool-down  Performed on first and last piece of equipment    Resistance Training Performed  Yes    VAD Patient?  No      Pain Assessment   Currently in Pain?  No/denies        Exercise Prescription Changes - 04/15/17 1300      Response to Exercise   Blood Pressure (Admit)  134/74    Blood Pressure (Exercise)  168/70    Blood Pressure (Exit)  100/68    Heart Rate (Admit)  65 bpm    Heart Rate (Exercise)  153 bpm    Heart Rate (Exit)  81 bpm    Rating of Perceived Exertion (Exercise)  15    Symptoms  none    Duration  Continue with 45 min of aerobic exercise without signs/symptoms of physical distress.    Intensity  THRR unchanged      Progression   Progression  Continue to progress workloads to maintain intensity without signs/symptoms of physical distress.    Average METs  4.04      Resistance Training   Training Prescription  Yes    Weight  7 lbs    Reps  10-15      Interval Training   Interval Training  No      Treadmill   MPH  3.5    Grade  0    Minutes  15    METs  3.68      Elliptical   Level  3    Speed  5    Minutes  15      T5 Nustep   Level  6    Minutes  15    METs  4.4      Home Exercise Plan   Plans to continue  exercise at  Home (comment) treadmill    Frequency  Add 3 additional days to program exercise sessions.    Initial Home Exercises Provided  04/04/17       Social History   Tobacco Use  Smoking Status Former Smoker  Smokeless Tobacco Current User  . Types: Chew  Tobacco Comment   smoked some in high school - none since. Chews tobacco, offered cessation materials, patient declined stating he is not ready to quit chewing tobacco at this time since it is his livelyhood.     Goals Met:  Independence with exercise equipment Exercise tolerated well No report of cardiac concerns or symptoms Strength training completed today  Goals Unmet:  Not Applicable  Comments: Pt able to follow exercise prescription today without complaint.  Will continue to monitor for progression.   Dr. Emily Filbert is Medical Director for [facility]  Cardiac Rehabilitation and [facility].

## 2017-04-18 DIAGNOSIS — I214 Non-ST elevation (NSTEMI) myocardial infarction: Secondary | ICD-10-CM

## 2017-04-18 DIAGNOSIS — Z955 Presence of coronary angioplasty implant and graft: Secondary | ICD-10-CM

## 2017-04-18 NOTE — Progress Notes (Signed)
Daily Session Note  Patient Details  Name: Victor Ibarra. MRN: [id number] Date of Birth: May 25, 1975 Referring Provider:     Cardiac Rehab from 03/10/2017 in [facility] Cardiac and Pulmonary Rehab  Referring Provider  Kathlyn Sacramento MD      Encounter Date: 04/18/2017  Check In: Session Check In - 04/18/17 0920      Check-In   Location  [facility]-Cardiac & Pulmonary Rehab    Staff Present  Renita Papa, RN BSN;Jessica Luan Pulling, MA, ACSM RCEP, Exercise Physiologist;Malarie Tappen Oletta Darter, BA, ACSM CEP, Exercise Physiologist;Susanne Bice, RN, BSN, CCRP    Supervising physician immediately available to respond to emergencies  See telemetry face sheet for immediately available ER MD    Medication changes reported      No    Fall or balance concerns reported     No    Warm-up and Cool-down  Performed on first and last piece of equipment    Resistance Training Performed  Yes    VAD Patient?  No      Pain Assessment   Currently in Pain?  No/denies    Multiple Pain Sites  No          Social History   Tobacco Use  Smoking Status Former Smoker  Smokeless Tobacco Current User  . Types: Chew  Tobacco Comment   smoked some in high school - none since. Chews tobacco, offered cessation materials, patient declined stating he is not ready to quit chewing tobacco at this time since it is his livelyhood.     Goals Met:  Independence with exercise equipment Exercise tolerated well No report of cardiac concerns or symptoms Strength training completed today  Goals Unmet:  Not Applicable  Comments: Pt able to follow exercise prescription today without complaint.  Will continue to monitor for progression.    Dr. Emily Filbert is Medical Director for [place] and [facility].

## 2017-04-21 ENCOUNTER — Encounter: Payer: BLUE CROSS/BLUE SHIELD | Admitting: *Deleted

## 2017-04-21 DIAGNOSIS — Z955 Presence of coronary angioplasty implant and graft: Secondary | ICD-10-CM

## 2017-04-21 DIAGNOSIS — I214 Non-ST elevation (NSTEMI) myocardial infarction: Secondary | ICD-10-CM

## 2017-04-21 NOTE — Progress Notes (Signed)
Daily Session Note  Patient Details  Name: Victor Ibarra. MRN: [id number] Date of Birth: 01-Jun-1975 Referring Provider:     Cardiac Rehab from 03/10/2017 in [facility] Cardiac and Pulmonary Rehab  Referring Provider  Kathlyn Sacramento MD      Encounter Date: 04/21/2017  Check In: Session Check In - 04/21/17 0835      Check-In   Location  [facility]-Cardiac & Pulmonary Rehab    Staff Present  Earlean Shawl, BS, ACSM CEP, Exercise Physiologist;Susanne Bice, RN, BSN, CCRP;Elanie Hammitt Luan Pulling, MA, ACSM RCEP, Exercise Physiologist    Supervising physician immediately available to respond to emergencies  See telemetry face sheet for immediately available ER MD    Medication changes reported      No    Fall or balance concerns reported     No    Warm-up and Cool-down  Performed on first and last piece of equipment    Resistance Training Performed  Yes    VAD Patient?  No      Pain Assessment   Currently in Pain?  No/denies          Social History   Tobacco Use  Smoking Status Former Smoker  Smokeless Tobacco Architectural technologist  . Types: Chew  Tobacco Comment   smoked some in high school - none since. Chews tobacco, offered cessation materials, patient declined stating he is not ready to quit chewing tobacco at this time since it is his livelyhood.     Goals Met:  Independence with exercise equipment Exercise tolerated well No report of cardiac concerns or symptoms Strength training completed today  Goals Unmet:  Not Applicable  Comments: Pt able to follow exercise prescription today without complaint.  Will continue to monitor for progression.    Dr. Emily Filbert is Medical Director for [place] and [facility].

## 2017-04-23 ENCOUNTER — Encounter: Payer: BLUE CROSS/BLUE SHIELD | Admitting: *Deleted

## 2017-04-23 DIAGNOSIS — I214 Non-ST elevation (NSTEMI) myocardial infarction: Secondary | ICD-10-CM | POA: Diagnosis not present

## 2017-04-23 DIAGNOSIS — Z955 Presence of coronary angioplasty implant and graft: Secondary | ICD-10-CM

## 2017-04-23 NOTE — Progress Notes (Signed)
Daily Session Note  Patient Details  Name: Victor Ibarra. MRN: [id number] Date of Birth: [birth year] Referring Provider:     Cardiac Rehab from 03/10/2017 in [facility] Cardiac and Pulmonary Rehab  Referring Provider  Kathlyn Sacramento MD      Encounter Date: 04/23/2017  Check In: Session Check In - 04/23/17 1632      Check-In   Location  [facility]-Cardiac & Pulmonary Rehab    Staff Present  Renita Papa, RN Vickki Hearing, BA, ACSM CEP, Exercise Physiologist;Carroll Enterkin, RN, BSN    Supervising physician immediately available to respond to emergencies  See telemetry face sheet for immediately available ER MD    Medication changes reported      No    Fall or balance concerns reported     No    Warm-up and Cool-down  Performed on first and last piece of equipment    Resistance Training Performed  Yes    VAD Patient?  No      Pain Assessment   Currently in Pain?  No/denies          Social History   Tobacco Use  Smoking Status Former Smoker  Smokeless Tobacco Architectural technologist  . Types: Chew  Tobacco Comment   smoked some in high school - none since. Chews tobacco, offered cessation materials, patient declined stating he is not ready to quit chewing tobacco at this time since it is his livelyhood.     Goals Met:  Independence with exercise equipment Exercise tolerated well No report of cardiac concerns or symptoms Strength training completed today  Goals Unmet:  Not Applicable  Comments: Pt able to follow exercise prescription today without complaint.  Will continue to monitor for progression.    Dr. Emily Filbert is Medical Director for [place] and [facility].

## 2017-04-25 DIAGNOSIS — I214 Non-ST elevation (NSTEMI) myocardial infarction: Secondary | ICD-10-CM

## 2017-04-25 DIAGNOSIS — Z955 Presence of coronary angioplasty implant and graft: Secondary | ICD-10-CM

## 2017-04-25 NOTE — Progress Notes (Signed)
Daily Session Note  Patient Details  Name: Victor Ibarra. MRN: [id number] Date of Birth: 1976-03-28 Referring Provider:     Cardiac Rehab from 03/10/2017 in [facility] Cardiac and Pulmonary Rehab  Referring Provider  Kathlyn Sacramento MD      Encounter Date: 04/25/2017  Check In: Session Check In - 04/25/17 0846      Check-In   Location  [facility]-Cardiac & Pulmonary Rehab    Staff Present  Nada Maclachlan, BA, ACSM CEP, Exercise Physiologist;Meredith Sherryll Burger, RN Levie Heritage, MA, ACSM RCEP, Exercise Physiologist    Supervising physician immediately available to respond to emergencies  See telemetry face sheet for immediately available ER MD    Medication changes reported      No    Fall or balance concerns reported     No    Warm-up and Cool-down  Performed on first and last piece of equipment    Resistance Training Performed  Yes    VAD Patient?  No      Pain Assessment   Currently in Pain?  No/denies          Social History   Tobacco Use  Smoking Status Former Smoker  Smokeless Tobacco Architectural technologist  . Types: Chew  Tobacco Comment   smoked some in high school - none since. Chews tobacco, offered cessation materials, patient declined stating he is not ready to quit chewing tobacco at this time since it is his livelyhood.     Goals Met:  Independence with exercise equipment Exercise tolerated well No report of cardiac concerns or symptoms Strength training completed today  Goals Unmet:  Not Applicable  Comments: Pt able to follow exercise prescription today without complaint.  Will continue to monitor for progression.    Dr. Emily Filbert is Medical Director for [place] and [facility].

## 2017-04-28 ENCOUNTER — Encounter: Payer: BLUE CROSS/BLUE SHIELD | Admitting: *Deleted

## 2017-04-28 DIAGNOSIS — I214 Non-ST elevation (NSTEMI) myocardial infarction: Secondary | ICD-10-CM | POA: Diagnosis not present

## 2017-04-28 DIAGNOSIS — Z955 Presence of coronary angioplasty implant and graft: Secondary | ICD-10-CM

## 2017-04-28 NOTE — Progress Notes (Signed)
Daily Session Note  Patient Details  Name: Victor Ibarra. MRN: [id number] Date of Birth: 12/29/1975 Referring Provider:     Cardiac Rehab from 03/10/2017 in [facility] Cardiac and Pulmonary Rehab  Referring Provider  Kathlyn Sacramento MD      Encounter Date: 04/28/2017  Check In: Session Check In - 04/28/17 0824      Check-In   Location  [facility]-Cardiac & Pulmonary Rehab    Staff Present  Nada Maclachlan, BA, ACSM CEP, Exercise Physiologist;Jessica Luan Pulling, MA, ACSM RCEP, Exercise Physiologist;Diane [facility] RN,BSN    Supervising physician immediately available to respond to emergencies  See telemetry face sheet for immediately available ER MD    Medication changes reported      No    Fall or balance concerns reported     No    Warm-up and Cool-down  Performed on first and last piece of equipment    Resistance Training Performed  Yes    VAD Patient?  No      Pain Assessment   Currently in Pain?  No/denies          Social History   Tobacco Use  Smoking Status Former Smoker  Smokeless Tobacco Architectural technologist  . Types: Chew  Tobacco Comment   smoked some in high school - none since. Chews tobacco, offered cessation materials, patient declined stating he is not ready to quit chewing tobacco at this time since it is his livelyhood.     Goals Met:  Independence with exercise equipment Exercise tolerated well No report of cardiac concerns or symptoms Strength training completed today  Goals Unmet:  Not Applicable  Comments: Pt able to follow exercise prescription today without complaint.  Will continue to monitor for progression.    Dr. Emily Filbert is Medical Director for [place] and [facility].

## 2017-04-30 ENCOUNTER — Encounter: Payer: BLUE CROSS/BLUE SHIELD | Admitting: *Deleted

## 2017-04-30 DIAGNOSIS — Z955 Presence of coronary angioplasty implant and graft: Secondary | ICD-10-CM

## 2017-04-30 DIAGNOSIS — I214 Non-ST elevation (NSTEMI) myocardial infarction: Secondary | ICD-10-CM

## 2017-04-30 NOTE — Progress Notes (Signed)
Daily Session Note  Patient Details  Name: Victor Ibarra. MRN: [id number] Date of Birth: 1975/07/25 Referring Provider:     Cardiac Rehab from 03/10/2017 in [facility] Cardiac and Pulmonary Rehab  Referring Provider  Kathlyn Sacramento MD      Encounter Date: 04/30/2017  Check In: Session Check In - 04/30/17 0756      Check-In   Location  [facility]-Cardiac & Pulmonary Rehab    Staff Present  Heath Lark, RN, BSN, CCRP;Jessica [place], MA, ACSM RCEP, Exercise Physiologist;Other Joellyn Rued, BS, [place]     Supervising physician immediately available to respond to emergencies  See telemetry face sheet for immediately available ER MD    Medication changes reported      No    Fall or balance concerns reported     No    Tobacco Cessation  No Change    Warm-up and Cool-down  Performed on first and last piece of equipment    Resistance Training Performed  Yes    VAD Patient?  No      Pain Assessment   Currently in Pain?  No/denies    Pain Score  0-No pain    Multiple Pain Sites  No          Social History   Tobacco Use  Smoking Status Former Smoker  Smokeless Tobacco Current User  . Types: Chew  Tobacco Comment   smoked some in high school - none since. Chews tobacco, offered cessation materials, patient declined stating he is not ready to quit chewing tobacco at this time since it is his livelyhood.     Goals Met:  Independence with exercise equipment Exercise tolerated well No report of cardiac concerns or symptoms Strength training completed today  Goals Unmet:  Not Applicable  Comments: Pt able to follow exercise prescription today without complaint.  Will continue to monitor for progression.    Dr. Emily Filbert is Medical Director for [place] and [facility].

## 2017-05-02 DIAGNOSIS — Z955 Presence of coronary angioplasty implant and graft: Secondary | ICD-10-CM

## 2017-05-02 DIAGNOSIS — I214 Non-ST elevation (NSTEMI) myocardial infarction: Secondary | ICD-10-CM | POA: Diagnosis not present

## 2017-05-02 NOTE — Progress Notes (Signed)
Daily Session Note  Patient Details  Name: Victor Ibarra. MRN: [id number] Date of Birth: 12-26-1975 Referring Provider:     Cardiac Rehab from 03/10/2017 in [facility] Cardiac and Pulmonary Rehab  Referring Provider  Kathlyn Sacramento MD      Encounter Date: 05/02/2017  Check In: Session Check In - 05/02/17 0915      Check-In   Location  [facility]-Cardiac & Pulmonary Rehab    Staff Present  Renita Papa, RN BSN;Jessica Luan Pulling, MA, ACSM RCEP, Exercise Physiologist;Delphina Schum Oletta Darter, [place], ACSM CEP, Exercise Physiologist    Supervising physician immediately available to respond to emergencies  See telemetry face sheet for immediately available ER MD    Medication changes reported      No    Fall or balance concerns reported     No    Warm-up and Cool-down  Performed on first and last piece of equipment    Resistance Training Performed  Yes    VAD Patient?  No      Pain Assessment   Currently in Pain?  No/denies    Multiple Pain Sites  No          Social History   Tobacco Use  Smoking Status Former Smoker  Smokeless Tobacco Current User  . Types: Chew  Tobacco Comment   smoked some in high school - none since. Chews tobacco, offered cessation materials, patient declined stating he is not ready to quit chewing tobacco at this time since it is his livelyhood.     Goals Met:  Independence with exercise equipment Exercise tolerated well No report of cardiac concerns or symptoms Strength training completed today  Goals Unmet:  Not Applicable  Comments: Pt able to follow exercise prescription today without complaint.  Will continue to monitor for progression.    Dr. Emily Filbert is Medical Director for [place] and [facility].

## 2017-05-07 ENCOUNTER — Encounter: Payer: BLUE CROSS/BLUE SHIELD | Attending: Cardiovascular Disease

## 2017-05-07 ENCOUNTER — Encounter: Payer: Self-pay | Admitting: *Deleted

## 2017-05-07 DIAGNOSIS — I214 Non-ST elevation (NSTEMI) myocardial infarction: Secondary | ICD-10-CM | POA: Diagnosis present

## 2017-05-07 DIAGNOSIS — Z955 Presence of coronary angioplasty implant and graft: Secondary | ICD-10-CM

## 2017-05-07 NOTE — Progress Notes (Signed)
Cardiac Individual Treatment Plan  Patient Details  Name: Victor Ibarra. MRN: [id number] Date of Birth: Sep 10, 1975 Referring Provider:     Cardiac Rehab from 03/10/2017 in [facility] Cardiac and Pulmonary Rehab  Referring Provider  Kathlyn Sacramento MD      Initial Encounter Date:    Cardiac Rehab from 03/10/2017 in [facility] Cardiac and Pulmonary Rehab  Date  03/10/17  Referring Provider  Kathlyn Sacramento MD      Visit Diagnosis: NSTEMI (non-ST elevated myocardial infarction) [facility])  Status post coronary artery stent placement  Patient's Home Medications on Admission:  Current Outpatient Medications:  .  aspirin 81 MG chewable tablet, Chew 1 tablet (81 mg total) by mouth daily., Disp: 30 tablet, Rfl: 2 .  atorvastatin (LIPITOR) 80 MG tablet, Take 1 tablet (80 mg total) by mouth daily at 6 PM., Disp: 90 tablet, Rfl: 3 .  metoprolol succinate (TOPROL XL) 25 MG 24 hr tablet, Take 1 tablet (25 mg total) by mouth daily., Disp: 90 tablet, Rfl: 3 .  nitroGLYCERIN (NITROSTAT) 0.4 MG SL tablet, Place 1 tablet (0.4 mg total) under the tongue every 5 (five) minutes as needed for chest pain., Disp: 30 tablet, Rfl: 2 .  ticagrelor (BRILINTA) 90 MG TABS tablet, Take 1 tablet (90 mg total) by mouth 2 (two) times daily., Disp: 180 tablet, Rfl: 3  Past Medical History: Past Medical History:  Diagnosis Date  . CAD (coronary artery disease)    a. 11/2016 NSTEMI/PCI: LAD 60p w/ bridge.  FFR 0.81-->PCI performed 2/2 ACS/presumed plaque rupture (2.75x18 Xience Alpine DES). OTW nl cors, EF 55-65%.  . MVP (mitral valve prolapse)    a. 11/2014 Echo: EF 55-60%, mild LVh, inferolateral HK, mild MVP, mild MR, nl RV fxn.  . Obesity   . [facility] spotted fever    a. Dx 09/2016 - s/p 2 wks of doxycycline.  . Tick bite     Tobacco Use: Social History   Tobacco Use  Smoking Status Former Smoker  Smokeless Tobacco Current User  . Types: Chew  Tobacco Comment   smoked some in high school - none  since. Chews tobacco, offered cessation materials, patient declined stating he is not ready to quit chewing tobacco at this time since it is his livelyhood.     Labs: Recent Review Flowsheet Data    Labs for ITP Cardiac and Pulmonary Rehab Latest Ref Rng & Units 11/18/2016 11/19/2016 12/24/2016   Cholestrol 0 - 200 mg/dL 157 - 79   LDLCALC 0 - 99 mg/dL 93 - 32   HDL >40 mg/dL 37(L) - 36(L)   Trlycerides <150 mg/dL 137 - 56   Hemoglobin A1c 4.8 - 5.6 % - 5.2 -       Exercise Target Goals:    Exercise Program Goal: Individual exercise prescription set with THRR, safety & activity barriers. Participant demonstrates ability to understand and report RPE using BORG scale, to self-measure pulse accurately, and to acknowledge the importance of the exercise prescription.  Exercise Prescription Goal: Starting with aerobic activity 30 plus minutes a day, 3 days per week for initial exercise prescription. Provide home exercise prescription and guidelines that participant acknowledges understanding prior to discharge.  Activity Barriers & Risk Stratification: Activity Barriers & Cardiac Risk Stratification - 03/10/17 1209      Activity Barriers & Cardiac Risk Stratification   Activity Barriers  Back Problems sees a chiropractor approx once a month    Cardiac Risk Stratification  Moderate  6 Minute Walk: 6 Minute Walk    Row Name 03/10/17 1131         6 Minute Walk   Phase  Initial     Distance  1888 feet     Walk Time  6 minutes     # of Rest Breaks  0     MPH  3.58     METS  5.55     RPE  11     VO2 Peak  19.43     Symptoms  No     Resting HR  63 bpm     Resting BP  134/64     Resting Oxygen Saturation   99 %     Exercise Oxygen Saturation  during 6 min walk  99 %     Max Ex. HR  98 bpm     Max Ex. BP  154/70     2 Minute Post BP  120/60        Oxygen Initial Assessment:   Oxygen Re-Evaluation:   Oxygen Discharge (Final Oxygen Re-Evaluation):   Initial  Exercise Prescription: Initial Exercise Prescription - 03/10/17 1100      Date of Initial Exercise RX and Referring Provider   Date  03/10/17    Referring Provider  Kathlyn Sacramento MD      Treadmill   MPH  3.5    Grade  0    Minutes  15    METs  3.68      Elliptical   Level  2    Speed  2.7    Minutes  15      T5 Nustep   Level  4    SPM  100    Minutes  15    METs  3      Prescription Details   Frequency (times per week)  3    Duration  Progress to 45 minutes of aerobic exercise without signs/symptoms of physical distress      Intensity   THRR 40-80% of Max Heartrate  109-155    Ratings of Perceived Exertion  11-13    Perceived Dyspnea  0-4      Progression   Progression  Continue to progress workloads to maintain intensity without signs/symptoms of physical distress.      Resistance Training   Training Prescription  Yes    Weight  5 lbs    Reps  10-15       Perform Capillary Blood Glucose checks as needed.  Exercise Prescription Changes: Exercise Prescription Changes    Row Name 03/10/17 1100 04/03/17 1300 04/15/17 1300 04/30/17 1600       Response to Exercise   Blood Pressure (Admit)  134/64  132/60  134/74  122/62    Blood Pressure (Exercise)  154/70  142/76  168/70  146/64    Blood Pressure (Exit)  120/60  122/76  100/68  104/68    Heart Rate (Admit)  63 bpm  67 bpm  65 bpm  84 bpm    Heart Rate (Exercise)  98 bpm  126 bpm  153 bpm  159 bpm    Heart Rate (Exit)  70 bpm  78 bpm  81 bpm  81 bpm    Oxygen Saturation (Admit)  99 %  -  -  -    Oxygen Saturation (Exercise)  99 %  -  -  -    Rating of Perceived Exertion (Exercise)  11  13  15   15  Symptoms  none  none  none  none    Comments  walk test results  second full day of exercise  -  -    Duration  -  Progress to 45 minutes of aerobic exercise without signs/symptoms of physical distress  Continue with 45 min of aerobic exercise without signs/symptoms of physical distress.  Continue with 45 min of  aerobic exercise without signs/symptoms of physical distress.    Intensity  -  THRR unchanged  THRR unchanged  THRR unchanged      Progression   Progression  -  Continue to progress workloads to maintain intensity without signs/symptoms of physical distress.  Continue to progress workloads to maintain intensity without signs/symptoms of physical distress.  Continue to progress workloads to maintain intensity without signs/symptoms of physical distress.    Average METs  -  3.19  4.04  4.34      Resistance Training   Training Prescription  -  Yes  Yes  Yes    Weight  -  5 lbs  7 lbs  10 lbs    Reps  -  10-15  10-15  10-15      Interval Training   Interval Training  -  No  No  Yes    Equipment  -  -  -  Elliptical;Recumbant Elliptical    Comments  -  -  -  47mn off 30sec on      Treadmill   MPH  -  3.5  3.5  3.5    Grade  -  0  0  0    Minutes  -  15  15  15     METs  -  3.68  3.68  3.68      Elliptical   Level  -  1  3  3     Speed  -  2.7  5  5  interval peak 17 mph    Minutes  -  15  15  15       T5 Nustep   Level  -  4  6  6     Minutes  -  15  15  15     METs  -  2.7  4.4  5      Home Exercise Plan   Plans to continue exercise at  -  -  Home (comment) treadmill  Home (comment) treadmill    Frequency  -  -  Add 3 additional days to program exercise sessions.  Add 3 additional days to program exercise sessions.    Initial Home Exercises Provided  -  -  04/04/17  04/04/17       Exercise Comments: Exercise Comments    Row Name 03/31/17 [id number]          Exercise Comments  First full day of exercise!  Patient was oriented to gym and equipment including functions, settings, policies, and procedures.  Patient's individual exercise prescription and treatment plan were reviewed.  All starting workloads were established based on the results of the 6 minute walk test done at initial orientation visit.  The plan for exercise progression was also introduced and progression will be  customized based on patient's performance and goals          Exercise Goals and Review: Exercise Goals    Row Name 03/10/17 1140             Exercise Goals   Increase Physical Activity  Yes  Intervention  Provide advice, education, support and counseling about physical activity/exercise needs.;Develop an individualized exercise prescription for aerobic and resistive training based on initial evaluation findings, risk stratification, comorbidities and participant's personal goals.       Expected Outcomes  Achievement of increased cardiorespiratory fitness and enhanced flexibility, muscular endurance and strength shown through measurements of functional capacity and personal statement of participant.       Increase Strength and Stamina  Yes       Intervention  Develop an individualized exercise prescription for aerobic and resistive training based on initial evaluation findings, risk stratification, comorbidities and participant's personal goals.;Provide advice, education, support and counseling about physical activity/exercise needs.       Expected Outcomes  Achievement of increased cardiorespiratory fitness and enhanced flexibility, muscular endurance and strength shown through measurements of functional capacity and personal statement of participant.       Able to understand and use rate of perceived exertion (RPE) scale  Yes       Intervention  Provide education and explanation on how to use RPE scale       Expected Outcomes  Short Term: Able to use RPE daily in rehab to express subjective intensity level;Long Term:  Able to use RPE to guide intensity level when exercising independently       Knowledge and understanding of Target Heart Rate Range (THRR)  Yes       Intervention  Provide education and explanation of THRR including how the numbers were predicted and where they are located for reference       Expected Outcomes  Short Term: Able to state/look up THRR;Long Term: Able to use  THRR to govern intensity when exercising independently;Short Term: Able to use daily as guideline for intensity in rehab       Able to check pulse independently  Yes       Intervention  Provide education and demonstration on how to check pulse in carotid and radial arteries.;Review the importance of being able to check your own pulse for safety during independent exercise       Expected Outcomes  Short Term: Able to explain why pulse checking is important during independent exercise;Long Term: Able to check pulse independently and accurately       Understanding of Exercise Prescription  Yes       Intervention  Provide education, explanation, and written materials on patient's individual exercise prescription       Expected Outcomes  Short Term: Able to explain program exercise prescription;Long Term: Able to explain home exercise prescription to exercise independently          Exercise Goals Re-Evaluation : Exercise Goals Re-Evaluation    Row Name 04/03/17 1348 04/04/17 1024 04/15/17 1352 04/18/17 0922 04/30/17 1604     Exercise Goal Re-Evaluation   Exercise Goals Review  Increase Physical Activity;Increase Strength and Stamina;Understanding of Exercise Prescription  Understanding of Exercise Prescription;Knowledge and understanding of Target Heart Rate Range (THRR);Able to understand and use rate of perceived exertion (RPE) scale;Able to check pulse independently  Understanding of Exercise Prescription;Increase Strength and Stamina;Increase Physical Activity  Increase Physical Activity;Increase Strength and Stamina  Increase Physical Activity;Increase Strength and Stamina;Understanding of Exercise Prescription   Comments  Victor Ibarra is off to a good start in rehab.  He has completed two full days of exercise.  He is already up to level 4 on the NuStep and using 5 lbs weights.  We will continue to monitor his progression.   Reviewed home exercise  with pt today.  Pt plans to walk and use treadmill at home  for exercise.  Reviewed THR, pulse, RPE, sign and symptoms, NTG use, and when to call 911 or MD.  Also discussed weather considerations and indoor options.  Pt voiced understanding.  Victor Ibarra has been doing well in rehab.  He is already up to level 6 on the T5 NuStep.  He has also increased his weights to 7lbs.  We will continue to monitor his progress.   Victor Ibarra is walking 30-40 minutes on TM at night.  Victor Ibarra continues to do well in rehab.  He is really enjoying working hard during the intervals.  He has impressed his classmates with his hard work. He has really worked to push himself during the intervals.  The elliptical continues to be his hardest piece of equipment, but he likes the challenge of it. We will continue to monitor his progression.    Expected Outcomes  Short: Continue to attend rehab regularly.  Long: Work on increasing physical activity.    Short: Continue to use treadmill at home and increase speed here to match what he is already doing at home.  Long: Continue to exercise regularly.   Short: Continue to increase workloads.  Long: Exercise routinely  Short - Victor Ibarra will maintain home exercise.  Long - Victor Ibarra will maintain current home exercise and add more days when he finishes HT.  Short: Continue to work hard during intervals.  Long: Continue to exercise at home routinely.       Discharge Exercise Prescription (Final Exercise Prescription Changes): Exercise Prescription Changes - 04/30/17 1600      Response to Exercise   Blood Pressure (Admit)  122/62    Blood Pressure (Exercise)  146/64    Blood Pressure (Exit)  104/68    Heart Rate (Admit)  84 bpm    Heart Rate (Exercise)  159 bpm    Heart Rate (Exit)  81 bpm    Rating of Perceived Exertion (Exercise)  15    Symptoms  none    Duration  Continue with 45 min of aerobic exercise without signs/symptoms of physical distress.    Intensity  THRR unchanged      Progression   Progression  Continue to progress workloads to maintain  intensity without signs/symptoms of physical distress.    Average METs  4.34      Resistance Training   Training Prescription  Yes    Weight  10 lbs    Reps  10-15      Interval Training   Interval Training  Yes    Equipment  Elliptical;Recumbant Elliptical    Comments  25mn off 30sec on      Treadmill   MPH  3.5    Grade  0    Minutes  15    METs  3.68      Elliptical   Level  3    Speed  5 interval peak 17 mph    Minutes  15      T5 Nustep   Level  6    Minutes  15    METs  5      Home Exercise Plan   Plans to continue exercise at  Home (comment) treadmill    Frequency  Add 3 additional days to program exercise sessions.    Initial Home Exercises Provided  04/04/17       Nutrition:  Target Goals: Understanding of nutrition guidelines, daily intake of sodium <15036m cholesterol <20037m  calories 30% from fat and 7% or less from saturated fats, daily to have 5 or more servings of fruits and vegetables.  Biometrics: Pre Biometrics - 03/10/17 1140      Pre Biometrics   Height  6' 1.8" (1.875 m)    Weight  229 lb 8 oz (104.1 kg)    Waist Circumference  36 inches    Hip Circumference  43 inches    Waist to Hip Ratio  0.84 %    BMI (Calculated)  29.61    Single Leg Stand  30 seconds        Nutrition Therapy Plan and Nutrition Goals: Nutrition Therapy & Goals - 03/10/17 1206      Intervention Plan   Intervention  Prescribe, educate and counsel regarding individualized specific dietary modifications aiming towards targeted core components such as weight, hypertension, lipid management, diabetes, heart failure and other comorbidities.;Nutrition handout(s) given to patient.    Expected Outcomes  Short Term Goal: A plan has been developed with personal nutrition goals set during dietitian appointment.;Long Term Goal: Adherence to prescribed nutrition plan.;Short Term Goal: Understand basic principles of dietary content, such as calories, fat, sodium, cholesterol and  nutrients.       Nutrition Discharge: Rate Your Plate Scores: Nutrition Assessments - 03/10/17 1206      MEDFICTS Scores   Pre Score  26       Nutrition Goals Re-Evaluation: Nutrition Goals Re-Evaluation    Row Name 04/18/17 0924             Goals   Nutrition Goal  Meet with RD       Comment  04/23/17 scheduled       Expected Outcome  Short - Victor Ibarra will meet with RD Long - Victor Ibarra will follow recommendations of RD          Nutrition Goals Discharge (Final Nutrition Goals Re-Evaluation): Nutrition Goals Re-Evaluation - 04/18/17 0924      Goals   Nutrition Goal  Meet with RD    Comment  04/23/17 scheduled    Expected Outcome  Short - Victor Ibarra will meet with RD Long - Victor Ibarra will follow recommendations of RD       Psychosocial: Target Goals: Acknowledge presence or absence of significant depression and/or stress, maximize coping skills, provide positive support system. Participant is able to verbalize types and ability to use techniques and skills needed for reducing stress and depression.   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       Family Dynamics   Good Support System?  Yes wife and extended family      Barriers   Psychosocial barriers to participate in program  The patient should benefit from training in stress management and relaxation.      Screening Interventions   Interventions  Yes    Expected Outcomes  Short Term goal: Utilizing psychosocial counselor, staff and physician to assist with identification of specific Stressors or current issues interfering with healing process. Setting desired goal for each  stressor or current issue identified.;Long Term Goal: Stressors or current issues are  controlled or eliminated.;Long Term goal: The participant improves quality of Life and PHQ9 Scores as seen by post scores and/or verbalization of changes       Quality of Life Scores:  Quality of Life - 03/10/17 1141      Quality of Life Scores   Health/Function Pre  28.43 %    Socioeconomic Pre  25.83 %    Psych/Spiritual Pre  29.17 %    Family Pre  23.88 %    GLOBAL Pre  27.48 %       PHQ-9: Recent Review Flowsheet Data    Depression screen [facility] 2/9 03/10/2017   Decreased Interest 0   Down, Depressed, Hopeless 0   PHQ - 2 Score 0   Altered sleeping 0   Tired, decreased energy 0   Change in appetite 0   Feeling bad or failure about yourself  0   Trouble concentrating 0   Moving slowly or fidgety/restless 0   Suicidal thoughts 0   PHQ-9 Score 0   Difficult doing work/chores Not difficult at all     Interpretation of Total Score  Total Score Depression Severity:  1-4 = Minimal depression, 5-9 = Mild depression, 10-14 = Moderate depression, 15-19 = Moderately severe depression, 20-27 = Severe depression   Psychosocial Evaluation and Intervention: Psychosocial Evaluation - 03/31/17 0954      Psychosocial Evaluation & Interventions   Interventions  Encouraged to exercise with the program and follow exercise prescription;Stress management education    Comments  Counselor met with Mr. Sposito Victor Ibarra) today for initial psychosocial evaluation.   He is a 42 year old who had a heart attack this past summer and had a stent inserted.   He has a strong support system with a spouse of 7 years; a sister locally and a nephew  who works with him.  He reports sleeping well and having a good appetite.  He denies a history of depression or anxiety or any current symptoms.  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  He has goals to learn more about his condition and lose some more weight while in this program.  Staff will follow with Victor Ibarra throughout the course of this program.     Expected Outcomes  Victor Ibarra will benefit from consistent exercise to achieve his stated goals.  The educational and psychoeducational components of this program will educate him on his condition and help him cope more positively.  He will meet with the dietician to address his weight loss goals.    Continue Psychosocial Services   Follow up required by staff       Psychosocial Re-Evaluation:   Psychosocial Discharge (Final Psychosocial Re-Evaluation):   Vocational Rehabilitation: Provide vocational rehab assistance to qualifying candidates.   Vocational Rehab Evaluation & Intervention: Vocational Rehab - 03/10/17 1211      Initial Vocational Rehab Evaluation & Intervention   Assessment shows need for Vocational Rehabilitation  No       Education: Education Goals: Education classes will be provided on a variety of topics geared toward better understanding of heart health and risk factor modification. Participant will state understanding/return demonstration of topics presented as noted by education test scores.  Learning Barriers/Preferences: Learning Barriers/Preferences - 03/10/17 1210      Learning Barriers/Preferences   Learning Barriers  None    Learning Preferences  None  Education Topics: General Nutrition Guidelines/Fats and Fiber: -Group instruction provided by verbal, written material, models and posters to present the general guidelines for heart healthy nutrition. Gives an explanation and review of dietary fats and fiber.   Cardiac Rehab from 04/30/2017 in [facility] Cardiac and Pulmonary Rehab  Date  04/21/17  Educator  CR  Instruction Review Code  1- Verbalizes Understanding      Controlling Sodium/Reading Food Labels: -Group verbal and written material supporting the discussion of  sodium use in heart healthy nutrition. Review and explanation with models, verbal and written materials for utilization of the food label.   Cardiac Rehab from 04/30/2017 in [facility] Cardiac and Pulmonary Rehab  Date  04/21/17  Educator  CR  Instruction Review Code  1- Verbalizes Understanding      Exercise Physiology & Risk Factors: - Group verbal and written instruction with models to review the exercise physiology of the cardiovascular system and associated critical values. Details cardiovascular disease risk factors and the goals associated with each risk factor.   Cardiac Rehab from 04/30/2017 in [facility] Cardiac and Pulmonary Rehab  Date  04/30/17  Educator  [facility]  Instruction Review Code  1- Verbalizes Understanding      Aerobic Exercise & Resistance Training: - Gives group verbal and written discussion on the health impact of inactivity. On the components of aerobic and resistive training programs and the benefits of this training and how to safely progress through these programs.   Flexibility, Balance, General Exercise Guidelines: - Provides group verbal and written instruction on the benefits of flexibility and balance training programs. Provides general exercise guidelines with specific guidelines to those with heart or lung disease. Demonstration and skill practice provided.   Stress Management: - Provides group verbal and written instruction about the health risks of elevated stress, cause of high stress, and healthy ways to reduce stress.   Depression: - Provides group verbal and written instruction on the correlation between heart/lung disease and depressed mood, treatment options, and the stigmas associated with seeking treatment.   Cardiac Rehab from 04/30/2017 in [facility] Cardiac and Pulmonary Rehab  Date  04/16/17  Educator  [facility]  Instruction Review Code  1- Verbalizes Understanding      Anatomy & Physiology of the Heart: - Group verbal and written instruction and models  provide basic cardiac anatomy and physiology, with the coronary electrical and arterial systems. Review of: AMI, Angina, Valve disease, Heart Failure, Cardiac Arrhythmia, Pacemakers, and the ICD.   Cardiac Procedures: - Group verbal and written instruction to review commonly prescribed medications for heart disease. Reviews the medication, class of the drug, and side effects. Includes the steps to properly store meds and maintain the prescription regimen. (beta blockers and nitrates)   Cardiac Medications I: - Group verbal and written instruction to review commonly prescribed medications for heart disease. Reviews the medication, class of the drug, and side effects. Includes the steps to properly store meds and maintain the prescription regimen.   Cardiac Rehab from 04/30/2017 in [facility] Cardiac and Pulmonary Rehab  Date  03/31/17  Educator  CE  Instruction Review Code  1- Verbalizes Understanding      Cardiac Medications II: -Group verbal and written instruction to review commonly prescribed medications for heart disease. Reviews the medication, class of the drug, and side effects. (all other drug classes)    Go Sex-Intimacy & Heart Disease, Get SMART - Goal Setting: - Group verbal and written instruction through game format to discuss heart disease and the return  to sexual intimacy. Provides group verbal and written material to discuss and apply goal setting through the application of the S.M.A.R.T. Method.   Other Matters of the Heart: - Provides group verbal, written materials and models to describe Heart Failure, Angina, Valve Disease, Peripheral Artery Disease, and Diabetes in the realm of heart disease. Includes description of the disease process and treatment options available to the cardiac patient.   Exercise & Equipment Safety: - Individual verbal instruction and demonstration of equipment use and safety with use of the equipment.   Cardiac Rehab from 04/30/2017 in [facility] Cardiac  and Pulmonary Rehab  Date  03/10/17  Educator  KS  Instruction Review Code  1- Verbalizes Understanding      Infection Prevention: - Provides verbal and written material to individual with discussion of infection control including proper hand washing and proper equipment cleaning during exercise session.   Cardiac Rehab from 04/30/2017 in [facility] Cardiac and Pulmonary Rehab  Date  03/10/17  Educator  KS  Instruction Review Code  1- Verbalizes Understanding      Falls Prevention: - Provides verbal and written material to individual with discussion of falls prevention and safety.   Cardiac Rehab from 04/30/2017 in [facility] Cardiac and Pulmonary Rehab  Date  03/10/17  Educator  KS  Instruction Review Code  1- Verbalizes Understanding      Diabetes: - Individual verbal and written instruction to review signs/symptoms of diabetes, desired ranges of glucose level fasting, after meals and with exercise. Acknowledge that pre and post exercise glucose checks will be done for 3 sessions at entry of program.   Other: -Provides group and verbal instruction on various topics (see comments)    Knowledge Questionnaire Score: Knowledge Questionnaire Score - 03/10/17 1210      Knowledge Questionnaire Score   Pre Score  23/28 Correct answers reviewed with patient who verbalized understanding.       Core Components/Risk Factors/Patient Goals at Admission: Personal Goals and Risk Factors at Admission - 03/10/17 1201      Core Components/Risk Factors/Patient Goals on Admission    Weight Management  Yes    Intervention  Weight Management: Develop a combined nutrition and exercise program designed to reach desired caloric intake, while maintaining appropriate intake of nutrient and fiber, sodium and fats, and appropriate energy expenditure required for the weight goal.;Weight Management: Provide education and appropriate resources to help participant work on and attain dietary goals.;Weight  Management/Obesity: Establish reasonable short term and long term weight goals.;Obesity: Provide education and appropriate resources to help participant work on and attain dietary goals.    Admit Weight  229 lb 8 oz (104.1 kg)    Goal Weight: Short Term  220 lb (99.8 kg)    Goal Weight: Long Term  200 lb (90.7 kg)    Expected Outcomes  Short Term: Continue to assess and modify interventions until short term weight is achieved;Long Term: Adherence to nutrition and physical activity/exercise program aimed toward attainment of established weight goal;Weight Maintenance: Understanding of the daily nutrition guidelines, which includes 25-35% calories from fat, 7% or less cal from saturated fats, less than 280m cholesterol, less than 1.5gm of sodium, & 5 or more servings of fruits and vegetables daily;Weight Loss: Understanding of general recommendations for a balanced deficit meal plan, which promotes 1-2 lb weight loss per week and includes a negative energy balance of [phone] kcal/d;Understanding recommendations for meals to include 15-35% energy as protein, 25-35% energy from fat, 35-60% energy from carbohydrates, less than 2069m  of dietary cholesterol, 20-35 gm of total fiber daily;Understanding of distribution of calorie intake throughout the day with the consumption of 4-5 meals/snacks    Tobacco Cessation  Yes    Number of packs per day  -- does not smoke, only chews tobacco    Intervention  Assist the participant in steps to quit. Provide individualized education and counseling about committing to Tobacco Cessation, relapse prevention, and pharmacological support that can be provided by physician.;Advice worker, assist with locating and accessing local/national Quit Smoking programs, and support quit date choice. materials offered at initial med review, patient declined intormation and is not ready to quit.     Expected Outcomes  Short Term: Will demonstrate readiness to quit, by  selecting a quit date.;Short Term: Will quit all tobacco product use, adhering to prevention of relapse plan.;Long Term: Complete abstinence from all tobacco products for at least 12 months from quit date.    Hypertension  Yes    Intervention  Provide education on lifestyle modifcations including regular physical activity/exercise, weight management, moderate sodium restriction and increased consumption of fresh fruit, vegetables, and low fat dairy, alcohol moderation, and smoking cessation.;Monitor prescription use compliance.    Expected Outcomes  Short Term: Continued assessment and intervention until BP is < 140/39m HG in hypertensive participants. < 130/835mHG in hypertensive participants with diabetes, heart failure or chronic kidney disease.;Long Term: Maintenance of blood pressure at goal levels.    Lipids  Yes    Intervention  Provide education and support for participant on nutrition & aerobic/resistive exercise along with prescribed medications to achieve LDL <7090mHDL >55m11m  Expected Outcomes  Short Term: Participant states understanding of desired cholesterol values and is compliant with medications prescribed. Participant is following exercise prescription and nutrition guidelines.;Long Term: Cholesterol controlled with medications as prescribed, with individualized exercise RX and with personalized nutrition plan. Value goals: LDL < 70mg3mL > 40 mg.    Stress  Yes    Intervention  Offer individual and/or small group education and counseling on adjustment to heart disease, stress management and health-related lifestyle change. Teach and support self-help strategies.;Refer participants experiencing significant psychosocial distress to appropriate mental health specialists for further evaluation and treatment. When possible, include family members and significant others in education/counseling sessions.    Expected Outcomes  Short Term: Participant demonstrates changes in health-related  behavior, relaxation and other stress management skills, ability to obtain effective social support, and compliance with psychotropic medications if prescribed.;Long Term: Emotional wellbeing is indicated by absence of clinically significant psychosocial distress or social isolation.       Core Components/Risk Factors/Patient Goals Review:  Goals and Risk Factor Review    Row Name 04/18/17 0924             Core Components/Risk Factors/Patient Goals Review   Personal Goals Review  Weight Management/Obesity;Stress       Review  RoberHerbie Baltimoree she has no stress.  He is following the heart healthy plan from my fitness pal app and is reducing sodium and fat.  He states he is "stuck" at 219 lb and cant lose more.  He is down from 260 in July.  He is meeting with the RD 12/19.         Expected Outcomes  Short - RoberHerbie Ibarra meet with the RD and follow her guidlines.  Long - RoberHerbie Ibarra reach his weight goal of 200 lb.           Core Components/Risk Factors/Patient  Goals at Discharge (Final Review):  Goals and Risk Factor Review - 04/18/17 0924      Core Components/Risk Factors/Patient Goals Review   Personal Goals Review  Weight Management/Obesity;Stress    Review  Victor Ibarra state she has no stress.  He is following the heart healthy plan from my fitness pal app and is reducing sodium and fat.  He states he is "stuck" at 219 lb and cant lose more.  He is down from 260 in July.  He is meeting with the RD 12/19.      Expected Outcomes  Short - Victor Ibarra will meet with the RD and follow her guidlines.  Long - Victor Ibarra will reach his weight goal of 200 lb.        ITP Comments: ITP Comments    Row Name 03/10/17 1158 03/12/17 8841 03/31/17 1038 04/09/17 0556 05/07/17 6606   ITP Comments  Medical Review Completed; initial ITP created. Diagnosis Documentation can be found in [facility] encounter dated 11/18/2016 and 03/06/2017.  30 day review. Continue with ITP unless directed changes per Medical Director review.   New to program  Aflutter during Cardiac Rehab exercise today when heart rate 104. Copy taken to Dr. Tyrell Antonio office after Cardiac REhab. Will cont to monitor when he is in Cardiac Rehab.   30 day review. Continue with ITP unless directed changes per Medical Director review.   30 day review. Continue with ITP unless directed changes per Medical Director review.       Comments:

## 2017-05-07 NOTE — Progress Notes (Signed)
Daily Session Note  Patient Details  Name: Victor Ibarra. MRN: [id number] Date of Birth: [birth year] Referring Provider:     Cardiac Rehab from 03/10/2017 in [facility] Cardiac and Pulmonary Rehab  Referring Provider  Kathlyn Sacramento MD      Encounter Date: 05/07/2017  Check In: Session Check In - 05/07/17 0745      Check-In   Location  [facility]-Cardiac & Pulmonary Rehab    Staff Present  Justin Mend RCP,RRT,BSRT;Heath Lark, RN, BSN, CCRP;Jessica Luan Pulling, MA, ACSM RCEP, Exercise Physiologist    Supervising physician immediately available to respond to emergencies  See telemetry face sheet for immediately available ER MD    Medication changes reported      No    Fall or balance concerns reported     No    Warm-up and Cool-down  Performed on first and last piece of equipment    Resistance Training Performed  Yes    VAD Patient?  No      Pain Assessment   Currently in Pain?  No/denies          Social History   Tobacco Use  Smoking Status Former Smoker  Smokeless Tobacco Architectural technologist  . Types: Chew  Tobacco Comment   smoked some in high school - none since. Chews tobacco, offered cessation materials, patient declined stating he is not ready to quit chewing tobacco at this time since it is his livelyhood.     Goals Met:  Independence with exercise equipment Exercise tolerated well No report of cardiac concerns or symptoms Strength training completed today  Goals Unmet:  Not Applicable  Comments: Pt able to follow exercise prescription today without complaint.  Will continue to monitor for progression.   Dr. Emily Filbert is Medical Director for [place] and [facility].

## 2017-05-09 VITALS — Ht 73.8 in | Wt 225.0 lb

## 2017-05-09 DIAGNOSIS — I214 Non-ST elevation (NSTEMI) myocardial infarction: Secondary | ICD-10-CM | POA: Diagnosis not present

## 2017-05-09 DIAGNOSIS — Z955 Presence of coronary angioplasty implant and graft: Secondary | ICD-10-CM

## 2017-05-09 NOTE — Progress Notes (Signed)
Daily Session Note  Patient Details  Name: Woodford Strege. MRN: [id number] Date of Birth: 1975-09-11 Referring Provider:     Cardiac Rehab from 03/10/2017 in [facility] Cardiac and Pulmonary Rehab  Referring Provider  Kathlyn Sacramento MD      Encounter Date: 05/09/2017  Check In: Session Check In - 05/09/17 0756      Check-In   Location  [facility]-Cardiac & Pulmonary Rehab    Staff Present  Renita Papa, RN Vickki Hearing, BA, ACSM CEP, Exercise Physiologist;Jodene Polyak Luan Pulling, MA, ACSM RCEP, Exercise Physiologist    Supervising physician immediately available to respond to emergencies  See telemetry face sheet for immediately available ER MD    Medication changes reported      No    Fall or balance concerns reported     No    Warm-up and Cool-down  Performed on first and last piece of equipment    Resistance Training Performed  Yes    VAD Patient?  No      Pain Assessment   Currently in Pain?  No/denies          Social History   Tobacco Use  Smoking Status Former Smoker  Smokeless Tobacco Architectural technologist  . Types: Chew  Tobacco Comment   05/09/17 Herbie Baltimore continues to use dip.  He has cut back significantly but does not wish to quit.  He sees a Pharmacist, community regular for oral care.     Goals Met:  Independence with exercise equipment Exercise tolerated well Personal goals reviewed No report of cardiac concerns or symptoms Strength training completed today  Goals Unmet:  Not Applicable  Comments: Pt able to follow exercise prescription today without complaint.  Will continue to monitor for progression.  Herbie Baltimore graduated today from cardiac rehab with 24 sessions completed due to insurance copays.  Details of the patient's exercise prescription and what He needs to do in order to continue the prescription and progress were discussed with patient.  Patient was given a copy of prescription and goals.  Patient verbalized understanding.  Robert plans to continue to exercise by  walking and using his treadmill at home.    Dr. Emily Filbert is Medical Director for [facility] and [facility].

## 2017-05-09 NOTE — Progress Notes (Signed)
Cardiac Individual Treatment Plan  Patient Details  Name: Victor Ibarra. MRN: [id number] Date of Birth: 1975/10/20 Referring Provider:     Cardiac Rehab from 03/10/2017 in [facility] Cardiac and Pulmonary Rehab  Referring Provider  Kathlyn Sacramento MD      Initial Encounter Date:    Cardiac Rehab from 03/10/2017 in [facility] Cardiac and Pulmonary Rehab  Date  03/10/17  Referring Provider  Kathlyn Sacramento MD      Visit Diagnosis: NSTEMI (non-ST elevated myocardial infarction) [facility])  Status post coronary artery stent placement  Patient's Home Medications on Admission:  Current Outpatient Medications:  .  aspirin 81 MG chewable tablet, Chew 1 tablet (81 mg total) by mouth daily., Disp: 30 tablet, Rfl: 2 .  atorvastatin (LIPITOR) 80 MG tablet, Take 1 tablet (80 mg total) by mouth daily at 6 PM., Disp: 90 tablet, Rfl: 3 .  metoprolol succinate (TOPROL XL) 25 MG 24 hr tablet, Take 1 tablet (25 mg total) by mouth daily., Disp: 90 tablet, Rfl: 3 .  nitroGLYCERIN (NITROSTAT) 0.4 MG SL tablet, Place 1 tablet (0.4 mg total) under the tongue every 5 (five) minutes as needed for chest pain., Disp: 30 tablet, Rfl: 2 .  ticagrelor (BRILINTA) 90 MG TABS tablet, Take 1 tablet (90 mg total) by mouth 2 (two) times daily., Disp: 180 tablet, Rfl: 3  Past Medical History: Past Medical History:  Diagnosis Date  . CAD (coronary artery disease)    a. 11/2016 NSTEMI/PCI: LAD 60p w/ bridge.  FFR 0.81-->PCI performed 2/2 ACS/presumed plaque rupture (2.75x18 Xience Alpine DES). OTW nl cors, EF 55-65%.  . MVP (mitral valve prolapse)    a. 11/2014 Echo: EF 55-60%, mild LVh, inferolateral HK, mild MVP, mild MR, nl RV fxn.  . Obesity   . [facility] spotted fever    a. Dx 09/2016 - s/p 2 wks of doxycycline.  . Tick bite     Tobacco Use: Social History   Tobacco Use  Smoking Status Former Smoker  Smokeless Tobacco Current User  . Types: Chew  Tobacco Comment   05/09/17 Victor Ibarra continues to use dip.  He  has cut back significantly but does not wish to quit.  He sees a Pharmacist, community regular for oral care.     Labs: Recent Review Flowsheet Data    Labs for ITP Cardiac and Pulmonary Rehab Latest Ref Rng & Units 11/18/2016 11/19/2016 12/24/2016   Cholestrol 0 - 200 mg/dL 157 - 79   LDLCALC 0 - 99 mg/dL 93 - 32   HDL >40 mg/dL 37(L) - 36(L)   Trlycerides <150 mg/dL 137 - 56   Hemoglobin A1c 4.8 - 5.6 % - 5.2 -       Exercise Target Goals:    Exercise Program Goal: Individual exercise prescription set with THRR, safety & activity barriers. Participant demonstrates ability to understand and report RPE using BORG scale, to self-measure pulse accurately, and to acknowledge the importance of the exercise prescription.  Exercise Prescription Goal: Starting with aerobic activity 30 plus minutes a day, 3 days per week for initial exercise prescription. Provide home exercise prescription and guidelines that participant acknowledges understanding prior to discharge.  Activity Barriers & Risk Stratification: Activity Barriers & Cardiac Risk Stratification - 03/10/17 1209      Activity Barriers & Cardiac Risk Stratification   Activity Barriers  Back Problems sees a chiropractor approx once a month    Cardiac Risk Stratification  Moderate       6 Minute Walk: 6 Minute  Walk    Row Name 03/10/17 1131 05/09/17 0756       6 Minute Walk   Phase  Initial  Discharge    Distance  1888 feet  2130 feet    Distance % Change  -  12.8 %    Distance Feet Change  -  242 ft    Walk Time  6 minutes  6 minutes    # of Rest Breaks  0  0    MPH  3.58  4.03    METS  5.55  5.86    RPE  11  11    VO2 Peak  19.43  20.5    Symptoms  No  No    Resting HR  63 bpm  64 bpm    Resting BP  134/64  114/70    Resting Oxygen Saturation   99 %  -    Exercise Oxygen Saturation  during 6 min walk  99 %  -    Max Ex. HR  98 bpm  109 bpm    Max Ex. BP  154/70  126/64    2 Minute Post BP  120/60  -       Oxygen Initial  Assessment:   Oxygen Re-Evaluation:   Oxygen Discharge (Final Oxygen Re-Evaluation):   Initial Exercise Prescription: Initial Exercise Prescription - 03/10/17 1100      Date of Initial Exercise RX and Referring Provider   Date  03/10/17    Referring Provider  Kathlyn Sacramento MD      Treadmill   MPH  3.5    Grade  0    Minutes  15    METs  3.68      Elliptical   Level  2    Speed  2.7    Minutes  15      T5 Nustep   Level  4    SPM  100    Minutes  15    METs  3      Prescription Details   Frequency (times per week)  3    Duration  Progress to 45 minutes of aerobic exercise without signs/symptoms of physical distress      Intensity   THRR 40-80% of Max Heartrate  109-155    Ratings of Perceived Exertion  11-13    Perceived Dyspnea  0-4      Progression   Progression  Continue to progress workloads to maintain intensity without signs/symptoms of physical distress.      Resistance Training   Training Prescription  Yes    Weight  5 lbs    Reps  10-15       Perform Capillary Blood Glucose checks as needed.  Exercise Prescription Changes:  Exercise Prescription Changes    Row Name 03/10/17 1100 04/03/17 1300 04/15/17 1300 04/30/17 1600       Response to Exercise   Blood Pressure (Admit)  134/64  132/60  134/74  122/62    Blood Pressure (Exercise)  154/70  142/76  168/70  146/64    Blood Pressure (Exit)  120/60  122/76  100/68  104/68    Heart Rate (Admit)  63 bpm  67 bpm  65 bpm  84 bpm    Heart Rate (Exercise)  98 bpm  126 bpm  153 bpm  159 bpm    Heart Rate (Exit)  70 bpm  78 bpm  81 bpm  81 bpm    Oxygen Saturation (Admit)  99 %  -  -  -  Oxygen Saturation (Exercise)  99 %  -  -  -    Rating of Perceived Exertion (Exercise)  11  13  15  15     Symptoms  none  none  none  none    Comments  walk test results  second full day of exercise  -  -    Duration  -  Progress to 45 minutes of aerobic exercise without signs/symptoms of physical distress   Continue with 45 min of aerobic exercise without signs/symptoms of physical distress.  Continue with 45 min of aerobic exercise without signs/symptoms of physical distress.    Intensity  -  THRR unchanged  THRR unchanged  THRR unchanged      Progression   Progression  -  Continue to progress workloads to maintain intensity without signs/symptoms of physical distress.  Continue to progress workloads to maintain intensity without signs/symptoms of physical distress.  Continue to progress workloads to maintain intensity without signs/symptoms of physical distress.    Average METs  -  3.19  4.04  4.34      Resistance Training   Training Prescription  -  Yes  Yes  Yes    Weight  -  5 lbs  7 lbs  10 lbs    Reps  -  10-15  10-15  10-15      Interval Training   Interval Training  -  No  No  Yes    Equipment  -  -  -  Elliptical;Recumbant Elliptical    Comments  -  -  -  28mn off 30sec on      Treadmill   MPH  -  3.5  3.5  3.5    Grade  -  0  0  0    Minutes  -  15  15  15     METs  -  3.68  3.68  3.68      Elliptical   Level  -  1  3  3     Speed  -  2.7  5  5  interval peak 17 mph    Minutes  -  15  15  15       T5 Nustep   Level  -  4  6  6     Minutes  -  15  15  15     METs  -  2.7  4.4  5      Home Exercise Plan   Plans to continue exercise at  -  -  Home (comment) treadmill  Home (comment) treadmill    Frequency  -  -  Add 3 additional days to program exercise sessions.  Add 3 additional days to program exercise sessions.    Initial Home Exercises Provided  -  -  04/04/17  04/04/17       Exercise Comments:  Exercise Comments    Row Name 03/31/17 0161001/04/19 0803         Exercise Comments  First full day of exercise!  Patient was oriented to gym and equipment including functions, settings, policies, and procedures.  Patient's individual exercise prescription and treatment plan were reviewed.  All starting workloads were established based on the results of the 6 minute walk  test done at initial orientation visit.  The plan for exercise progression was also introduced and progression will be customized based on patient's performance and goals   RHerbie Baltimoregraduated today from cardiac rehab with 24 sessions completed due to insurance  copays.  Details of the patient's exercise prescription and what He needs to do in order to continue the prescription and progress were discussed with patient.  Patient was given a copy of prescription and goals.  Patient verbalized understanding.  Robert plans to continue to exercise by walking and using his treadmill at home.         Exercise Goals and Review:  Exercise Goals    Row Name 03/10/17 1140             Exercise Goals   Increase Physical Activity  Yes       Intervention  Provide advice, education, support and counseling about physical activity/exercise needs.;Develop an individualized exercise prescription for aerobic and resistive training based on initial evaluation findings, risk stratification, comorbidities and participant's personal goals.       Expected Outcomes  Achievement of increased cardiorespiratory fitness and enhanced flexibility, muscular endurance and strength shown through measurements of functional capacity and personal statement of participant.       Increase Strength and Stamina  Yes       Intervention  Develop an individualized exercise prescription for aerobic and resistive training based on initial evaluation findings, risk stratification, comorbidities and participant's personal goals.;Provide advice, education, support and counseling about physical activity/exercise needs.       Expected Outcomes  Achievement of increased cardiorespiratory fitness and enhanced flexibility, muscular endurance and strength shown through measurements of functional capacity and personal statement of participant.       Able to understand and use rate of perceived exertion (RPE) scale  Yes       Intervention  Provide education  and explanation on how to use RPE scale       Expected Outcomes  Short Term: Able to use RPE daily in rehab to express subjective intensity level;Long Term:  Able to use RPE to guide intensity level when exercising independently       Knowledge and understanding of Target Heart Rate Range (THRR)  Yes       Intervention  Provide education and explanation of THRR including how the numbers were predicted and where they are located for reference       Expected Outcomes  Short Term: Able to state/look up THRR;Long Term: Able to use THRR to govern intensity when exercising independently;Short Term: Able to use daily as guideline for intensity in rehab       Able to check pulse independently  Yes       Intervention  Provide education and demonstration on how to check pulse in carotid and radial arteries.;Review the importance of being able to check your own pulse for safety during independent exercise       Expected Outcomes  Short Term: Able to explain why pulse checking is important during independent exercise;Long Term: Able to check pulse independently and accurately       Understanding of Exercise Prescription  Yes       Intervention  Provide education, explanation, and written materials on patient's individual exercise prescription       Expected Outcomes  Short Term: Able to explain program exercise prescription;Long Term: Able to explain home exercise prescription to exercise independently          Exercise Goals Re-Evaluation : Exercise Goals Re-Evaluation    Row Name 04/03/17 1348 04/04/17 1024 04/15/17 1352 04/18/17 0922 04/30/17 1604     Exercise Goal Re-Evaluation   Exercise Goals Review  Increase Physical Activity;Increase Strength and Stamina;Understanding of Exercise Prescription  Understanding of Exercise Prescription;Knowledge and understanding of Target Heart Rate Range (THRR);Able to understand and use rate of perceived exertion (RPE) scale;Able to check pulse independently   Understanding of Exercise Prescription;Increase Strength and Stamina;Increase Physical Activity  Increase Physical Activity;Increase Strength and Stamina  Increase Physical Activity;Increase Strength and Stamina;Understanding of Exercise Prescription   Comments  Victor Ibarra is off to a good start in rehab.  He has completed two full days of exercise.  He is already up to level 4 on the NuStep and using 5 lbs weights.  We will continue to monitor his progression.   Reviewed home exercise with pt today.  Pt plans to walk and use treadmill at home for exercise.  Reviewed THR, pulse, RPE, sign and symptoms, NTG use, and when to call 911 or MD.  Also discussed weather considerations and indoor options.  Pt voiced understanding.  Victor Ibarra has been doing well in rehab.  He is already up to level 6 on the T5 NuStep.  He has also increased his weights to 7lbs.  We will continue to monitor his progress.   Victor Ibarra is walking 30-40 minutes on TM at night.  Victor Ibarra continues to do well in rehab.  He is really enjoying working hard during the intervals.  He has impressed his classmates with his hard work. He has really worked to push himself during the intervals.  The elliptical continues to be his hardest piece of equipment, but he likes the challenge of it. We will continue to monitor his progression.    Expected Outcomes  Short: Continue to attend rehab regularly.  Long: Work on increasing physical activity.    Short: Continue to use treadmill at home and increase speed here to match what he is already doing at home.  Long: Continue to exercise regularly.   Short: Continue to increase workloads.  Long: Exercise routinely  Short - Victor Ibarra will maintain home exercise.  Long - Victor Ibarra will maintain current home exercise and add more days when he finishes HT.  Short: Continue to work hard during intervals.  Long: Continue to exercise at home routinely.       Discharge Exercise Prescription (Final Exercise Prescription  Changes): Exercise Prescription Changes - 04/30/17 1600      Response to Exercise   Blood Pressure (Admit)  122/62    Blood Pressure (Exercise)  146/64    Blood Pressure (Exit)  104/68    Heart Rate (Admit)  84 bpm    Heart Rate (Exercise)  159 bpm    Heart Rate (Exit)  81 bpm    Rating of Perceived Exertion (Exercise)  15    Symptoms  none    Duration  Continue with 45 min of aerobic exercise without signs/symptoms of physical distress.    Intensity  THRR unchanged      Progression   Progression  Continue to progress workloads to maintain intensity without signs/symptoms of physical distress.    Average METs  4.34      Resistance Training   Training Prescription  Yes    Weight  10 lbs    Reps  10-15      Interval Training   Interval Training  Yes    Equipment  Elliptical;Recumbant Elliptical    Comments  24mn off 30sec on      Treadmill   MPH  3.5    Grade  0    Minutes  15    METs  3.68      Elliptical   Level  3    Speed  5 interval peak 17 mph    Minutes  15      T5 Nustep   Level  6    Minutes  15    METs  5      Home Exercise Plan   Plans to continue exercise at  Home (comment) treadmill    Frequency  Add 3 additional days to program exercise sessions.    Initial Home Exercises Provided  04/04/17       Nutrition:  Target Goals: Understanding of nutrition guidelines, daily intake of sodium <1568m, cholesterol <206m calories 30% from fat and 7% or less from saturated fats, daily to have 5 or more servings of fruits and vegetables.  Biometrics: Pre Biometrics - 03/10/17 1140      Pre Biometrics   Height  6' 1.8" (1.875 m)    Weight  229 lb 8 oz (104.1 kg)    Waist Circumference  36 inches    Hip Circumference  43 inches    Waist to Hip Ratio  0.84 %    BMI (Calculated)  29.61    Single Leg Stand  30 seconds      Post Biometrics - 05/09/17 1010       Post  Biometrics   Height  6' 1.8" (1.875 m)    Weight  225 lb (102.1 kg)    Waist  Circumference  35.5 inches    Hip Circumference  43 inches    Waist to Hip Ratio  0.83 %    BMI (Calculated)  29.03    Single Leg Stand  30 seconds       Nutrition Therapy Plan and Nutrition Goals: Nutrition Therapy & Goals - 03/10/17 1206      Intervention Plan   Intervention  Prescribe, educate and counsel regarding individualized specific dietary modifications aiming towards targeted core components such as weight, hypertension, lipid management, diabetes, heart failure and other comorbidities.;Nutrition handout(s) given to patient.    Expected Outcomes  Short Term Goal: A plan has been developed with personal nutrition goals set during dietitian appointment.;Long Term Goal: Adherence to prescribed nutrition plan.;Short Term Goal: Understand basic principles of dietary content, such as calories, fat, sodium, cholesterol and nutrients.       Nutrition Discharge: Rate Your Plate Scores: Nutrition Assessments - 05/09/17 0759      MEDFICTS Scores   Pre Score  26    Post Score  10    Score Difference  -16       Nutrition Goals Re-Evaluation: Nutrition Goals Re-Evaluation    Row Name 04/18/17 0924             Goals   Nutrition Goal  Meet with RD       Comment  04/23/17 scheduled       Expected Outcome  Short - RoHerbie Baltimoreill meet with RD Long - RoHerbie Baltimoreill follow recommendations of RD          Nutrition Goals Discharge (Final Nutrition Goals Re-Evaluation): Nutrition Goals Re-Evaluation - 04/18/17 0924      Goals   Nutrition Goal  Meet with RD    Comment  04/23/17 scheduled    Expected Outcome  Short - RoHerbie Baltimoreill meet with RD Long - RoHerbie Baltimoreill follow recommendations of RD       Psychosocial: Target Goals: Acknowledge presence or absence of significant depression and/or stress, maximize coping skills, provide positive support system. Participant is able to verbalize types and ability to  use techniques and skills needed for reducing stress and depression.   Initial  Review & Psychosocial Screening: Initial Psych Review & Screening - 03/10/17 1207      Initial Review   Current issues with  Current Stress Concerns    Source of Stress Concerns  Family;Occupation    Comments  His dad has taken more ill with health issues for the past 2 years and he is very involved in the care for his dad. 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       Family Dynamics   Good Support System?  Yes wife and extended family      Barriers   Psychosocial barriers to participate in program  The patient should benefit from training in stress management and relaxation.      Screening Interventions   Interventions  Yes    Expected Outcomes  Short Term goal: Utilizing psychosocial counselor, staff and physician to assist with identification of specific Stressors or current issues interfering with healing process. Setting desired goal for each stressor or current issue identified.;Long Term Goal: Stressors or current issues are controlled or eliminated.;Long Term goal: The participant improves quality of Life and PHQ9 Scores as seen by post scores and/or verbalization of changes       Quality of Life Scores:  Quality of Life - 05/09/17 0759      Quality of Life Scores   Health/Function Pre  28.43 %    Health/Function Post  29.67 %    Health/Function % Change  4.36 %    Socioeconomic Pre  25.83 %    Socioeconomic Post  27.14 %    Socioeconomic % Change   5.07 %    Psych/Spiritual Pre  29.17 %    Psych/Spiritual Post  28.93 %    Psych/Spiritual % Change  -0.82 %    Family Pre  23.88 %    Family Post  26 %    Family % Change  8.88 %    GLOBAL Pre  27.48 %    GLOBAL Post  28.53 %    GLOBAL % Change  3.82 %       PHQ-9: Recent Review Flowsheet Data    Depression screen [facility] 2/9 05/09/2017 03/10/2017   Decreased Interest 0 0   Down, Depressed, Hopeless 0 0   PHQ - 2 Score 0 0   Altered sleeping 0 0   Tired, decreased energy 0 0   Change in appetite 0 0    Feeling bad or failure about yourself  0 0   Trouble concentrating 0 0   Moving slowly or fidgety/restless 0 0   Suicidal thoughts 0 0   PHQ-9 Score 0 0   Difficult doing work/chores Not difficult at all Not difficult at all     Interpretation of Total Score  Total Score Depression Severity:  1-4 = Minimal depression, 5-9 = Mild depression, 10-14 = Moderate depression, 15-19 = Moderately severe depression, 20-27 = Severe depression   Psychosocial Evaluation and Intervention: Psychosocial Evaluation - 03/31/17 0954      Psychosocial Evaluation & Interventions   Interventions  Encouraged to exercise with the program and follow exercise prescription;Stress management education    Comments  Counselor met with Mr. Stemm Victor Ibarra) today for initial psychosocial evaluation.   He is a 42 year old who had a heart attack this past summer and had a stent inserted.   He has a strong support system with a spouse of  71 years; a sister locally and a nephew  who works with him.  He reports sleeping well and having a good appetite.  He denies a history of depression or anxiety or any current symptoms.  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  He has goals to learn more about his condition and lose some more weight while in this program.  Staff will follow with Victor Ibarra throughout the course of this program.     Expected Outcomes  Victor Ibarra will benefit from consistent exercise to achieve his stated goals.  The educational and psychoeducational components of this program will educate him on his condition and help him cope more positively.  He will meet with the dietician to address his weight loss goals.    Continue Psychosocial Services   Follow up required by staff       Psychosocial Re-Evaluation:   Psychosocial Discharge (Final Psychosocial Re-Evaluation):   Vocational Rehabilitation: Provide vocational  rehab assistance to qualifying candidates.   Vocational Rehab Evaluation & Intervention: Vocational Rehab - 03/10/17 1211      Initial Vocational Rehab Evaluation & Intervention   Assessment shows need for Vocational Rehabilitation  No       Education: Education Goals: Education classes will be provided on a variety of topics geared toward better understanding of heart health and risk factor modification. Participant will state understanding/return demonstration of topics presented as noted by education test scores.  Learning Barriers/Preferences: Learning Barriers/Preferences - 03/10/17 1210      Learning Barriers/Preferences   Learning Barriers  None    Learning Preferences  None       Education Topics: General Nutrition Guidelines/Fats and Fiber: -Group instruction provided by verbal, written material, models and posters to present the general guidelines for heart healthy nutrition. Gives an explanation and review of dietary fats and fiber.   Cardiac Rehab from 05/07/2017 in [facility] Cardiac and Pulmonary Rehab  Date  04/21/17  Educator  CR  Instruction Review Code  1- Verbalizes Understanding      Controlling Sodium/Reading Food Labels: -Group verbal and written material supporting the discussion of sodium use in heart healthy nutrition. Review and explanation with models, verbal and written materials for utilization of the food label.   Cardiac Rehab from 05/07/2017 in [facility] Cardiac and Pulmonary Rehab  Date  04/21/17  Educator  CR  Instruction Review Code  1- Verbalizes Understanding      Exercise Physiology & Risk Factors: - Group verbal and written instruction with models to review the exercise physiology of the cardiovascular system and associated critical values. Details cardiovascular disease risk factors and the goals associated with each risk factor.   Cardiac Rehab from 05/07/2017 in [facility] Cardiac and Pulmonary Rehab  Date  04/30/17  Educator  [facility]  Instruction Review  Code  1- Verbalizes Understanding      Aerobic Exercise & Resistance Training: - Gives group verbal and written discussion on the health impact of inactivity. On the components of aerobic and resistive training programs and the benefits of this training and how to safely progress through these programs.   Cardiac Rehab from 05/07/2017 in [facility] Cardiac and Pulmonary Rehab  Date  05/07/17  Educator  [facility]  Instruction Review Code  1- Verbalizes Understanding      Flexibility, Balance, General Exercise Guidelines: - Provides group verbal and written instruction on the benefits of flexibility and balance training  programs. Provides general exercise guidelines with specific guidelines to those with heart or lung disease. Demonstration and skill practice provided.   Stress Management: - Provides group verbal and written instruction about the health risks of elevated stress, cause of high stress, and healthy ways to reduce stress.   Depression: - Provides group verbal and written instruction on the correlation between heart/lung disease and depressed mood, treatment options, and the stigmas associated with seeking treatment.   Cardiac Rehab from 05/07/2017 in [facility] Cardiac and Pulmonary Rehab  Date  04/16/17  Educator  [facility]  Instruction Review Code  1- Verbalizes Understanding      Anatomy & Physiology of the Heart: - Group verbal and written instruction and models provide basic cardiac anatomy and physiology, with the coronary electrical and arterial systems. Review of: AMI, Angina, Valve disease, Heart Failure, Cardiac Arrhythmia, Pacemakers, and the ICD.   Cardiac Procedures: - Group verbal and written instruction to review commonly prescribed medications for heart disease. Reviews the medication, class of the drug, and side effects. Includes the steps to properly store meds and maintain the prescription regimen. (beta blockers and nitrates)   Cardiac Medications I: - Group verbal and written  instruction to review commonly prescribed medications for heart disease. Reviews the medication, class of the drug, and side effects. Includes the steps to properly store meds and maintain the prescription regimen.   Cardiac Rehab from 05/07/2017 in [facility] Cardiac and Pulmonary Rehab  Date  03/31/17  Educator  [place]  Instruction Review Code  1- Verbalizes Understanding      Cardiac Medications II: -Group verbal and written instruction to review commonly prescribed medications for heart disease. Reviews the medication, class of the drug, and side effects. (all other drug classes)    Go Sex-Intimacy & Heart Disease, Get SMART - Goal Setting: - Group verbal and written instruction through game format to discuss heart disease and the return to sexual intimacy. Provides group verbal and written material to discuss and apply goal setting through the application of the S.M.A.R.T. Method.   Other Matters of the Heart: - Provides group verbal, written materials and models to describe Heart Failure, Angina, Valve Disease, Peripheral Artery Disease, and Diabetes in the realm of heart disease. Includes description of the disease process and treatment options available to the cardiac patient.   Exercise & Equipment Safety: - Individual verbal instruction and demonstration of equipment use and safety with use of the equipment.   Cardiac Rehab from 05/07/2017 in [facility] Cardiac and Pulmonary Rehab  Date  03/10/17  Educator  [place]  Instruction Review Code  1- Verbalizes Understanding      Infection Prevention: - Provides verbal and written material to individual with discussion of infection control including proper hand washing and proper equipment cleaning during exercise session.   Cardiac Rehab from 05/07/2017 in [facility] Cardiac and Pulmonary Rehab  Date  03/10/17  Educator  [place]  Instruction Review Code  1- Verbalizes Understanding      Falls Prevention: - Provides verbal and written material to individual  with discussion of falls prevention and safety.   Cardiac Rehab from 05/07/2017 in [facility] Cardiac and Pulmonary Rehab  Date  03/10/17  Educator  [place]  Instruction Review Code  1- Verbalizes Understanding      Diabetes: - Individual verbal and written instruction to review signs/symptoms of diabetes, desired ranges of glucose level fasting, after meals and with exercise. Acknowledge that pre and post exercise glucose checks will be done for 3 sessions at  entry of program.   Other: -Provides group and verbal instruction on various topics (see comments)    Knowledge Questionnaire Score: Knowledge Questionnaire Score - 05/09/17 0758      Knowledge Questionnaire Score   Pre Score  23/28    Post Score  26/28 results reviewed with pt       Core Components/Risk Factors/Patient Goals at Admission: Personal Goals and Risk Factors at Admission - 03/10/17 1201      Core Components/Risk Factors/Patient Goals on Admission    Weight Management  Yes    Intervention  Weight Management: Develop a combined nutrition and exercise program designed to reach desired caloric intake, while maintaining appropriate intake of nutrient and fiber, sodium and fats, and appropriate energy expenditure required for the weight goal.;Weight Management: Provide education and appropriate resources to help participant work on and attain dietary goals.;Weight Management/Obesity: Establish reasonable short term and long term weight goals.;Obesity: Provide education and appropriate resources to help participant work on and attain dietary goals.    Admit Weight  229 lb 8 oz (104.1 kg)    Goal Weight: Short Term  220 lb (99.8 kg)    Goal Weight: Long Term  200 lb (90.7 kg)    Expected Outcomes  Short Term: Continue to assess and modify interventions until short term weight is achieved;Long Term: Adherence to nutrition and physical activity/exercise program aimed toward attainment of established weight goal;Weight Maintenance:  Understanding of the daily nutrition guidelines, which includes 25-35% calories from fat, 7% or less cal from saturated fats, less than 226m cholesterol, less than 1.5gm of sodium, & 5 or more servings of fruits and vegetables daily;Weight Loss: Understanding of general recommendations for a balanced deficit meal plan, which promotes 1-2 lb weight loss per week and includes a negative energy balance of [phone] kcal/d;Understanding recommendations for meals to include 15-35% energy as protein, 25-35% energy from fat, 35-60% energy from carbohydrates, less than 2019mof dietary cholesterol, 20-35 gm of total fiber daily;Understanding of distribution of calorie intake throughout the day with the consumption of 4-5 meals/snacks    Tobacco Cessation  Yes    Number of packs per day  -- does not smoke, only chews tobacco    Intervention  Assist the participant in steps to quit. Provide individualized education and counseling about committing to Tobacco Cessation, relapse prevention, and pharmacological support that can be provided by physician.;OfAdvice workerassist with locating and accessing local/national Quit Smoking programs, and support quit date choice. materials offered at initial med review, patient declined intormation and is not ready to quit.     Expected Outcomes  Short Term: Will demonstrate readiness to quit, by selecting a quit date.;Short Term: Will quit all tobacco product use, adhering to prevention of relapse plan.;Long Term: Complete abstinence from all tobacco products for at least 12 months from quit date.    Hypertension  Yes    Intervention  Provide education on lifestyle modifcations including regular physical activity/exercise, weight management, moderate sodium restriction and increased consumption of fresh fruit, vegetables, and low fat dairy, alcohol moderation, and smoking cessation.;Monitor prescription use compliance.    Expected Outcomes  Short Term: Continued  assessment and intervention until BP is < 140/9067mG in hypertensive participants. < 130/51m3m in hypertensive participants with diabetes, heart failure or chronic kidney disease.;Long Term: Maintenance of blood pressure at goal levels.    Lipids  Yes    Intervention  Provide education and support for participant on nutrition & aerobic/resistive exercise along with  prescribed medications to achieve LDL <70m, HDL >458m    Expected Outcomes  Short Term: Participant states understanding of desired cholesterol values and is compliant with medications prescribed. Participant is following exercise prescription and nutrition guidelines.;Long Term: Cholesterol controlled with medications as prescribed, with individualized exercise RX and with personalized nutrition plan. Value goals: LDL < 7064mHDL > 40 mg.    Stress  Yes    Intervention  Offer individual and/or small group education and counseling on adjustment to heart disease, stress management and health-related lifestyle change. Teach and support self-help strategies.;Refer participants experiencing significant psychosocial distress to appropriate mental health specialists for further evaluation and treatment. When possible, include family members and significant others in education/counseling sessions.    Expected Outcomes  Short Term: Participant demonstrates changes in health-related behavior, relaxation and other stress management skills, ability to obtain effective social support, and compliance with psychotropic medications if prescribed.;Long Term: Emotional wellbeing is indicated by absence of clinically significant psychosocial distress or social isolation.       Core Components/Risk Factors/Patient Goals Review:  Goals and Risk Factor Review    Row Name 04/18/17 0924             Core Components/Risk Factors/Patient Goals Review   Personal Goals Review  Weight Management/Obesity;Stress       Review  RobHerbie Baltimoreate she has no stress.  He  is following the heart healthy plan from my fitness pal app and is reducing sodium and fat.  He states he is "stuck" at 219 lb and cant lose more.  He is down from 260 in July.  He is meeting with the RD 12/19.         Expected Outcomes  Short - RobHerbie Baltimorell meet with the RD and follow her guidlines.  Long - RobHerbie Baltimorell reach his weight goal of 200 lb.           Core Components/Risk Factors/Patient Goals at Discharge (Final Review):  Goals and Risk Factor Review - 04/18/17 0924      Core Components/Risk Factors/Patient Goals Review   Personal Goals Review  Weight Management/Obesity;Stress    Review  RobHerbie Baltimoreate she has no stress.  He is following the heart healthy plan from my fitness pal app and is reducing sodium and fat.  He states he is "stuck" at 219 lb and cant lose more.  He is down from 260 in July.  He is meeting with the RD 12/19.      Expected Outcomes  Short - RobHerbie Baltimorell meet with the RD and follow her guidlines.  Long - RobHerbie Baltimorell reach his weight goal of 200 lb.        ITP Comments: ITP Comments    Row Name 03/10/17 1158 03/12/17 [id number]/26/18 1038 04/09/17 0556 05/07/17 [id number] Comments  Medical Review Completed; initial ITP created. [facility] 03/06/2017.  30 day review. Continue with ITP unless directed changes per Medical Director review.  New to program  [place]utter during Cardiac Rehab exercise today when heart rate 104. Copy taken to Dr. AriTyrell Antoniofice after Cardiac REhab. Will cont to monitor when he is in Cardiac Rehab.   30 day review. Continue with ITP unless directed changes per Medical Director review.   30 day review. Continue with ITP unless directed changes per Medical Director review.    [place] 05/09/17 0936           ITP Comments  Discharge ITP sent and signed by Dr. Sabra Heck.  Discharge Summary routed to PCP and cardiologist.          Comments: Discharge ITP

## 2017-05-09 NOTE — Progress Notes (Addendum)
Discharge Progress Report  Patient Details  Name: Victor Ibarra. MRN: [id number] Date of Birth: May 02, 1976 Referring Provider:     Cardiac Rehab from 03/10/2017 in [facility]  Referring Provider  Kathlyn Sacramento MD       Number of Visits: 24/36  Reason for Discharge:  Patient reached a stable level of exercise. Patient independent in their exercise. Patient has met program and personal goals. 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  He has cut back significantly but does not wish to quit.  He sees a Pharmacist, community regular for oral care.     Diagnosis:  NSTEMI (non-ST elevated myocardial infarction) ([place])  Status post coronary artery stent placement  ADL [facility]:   Initial Exercise Prescription: Initial Exercise Prescription - 03/10/17 1100      Date of Initial Exercise RX and Referring Provider   Date  03/10/17    Referring Provider  Kathlyn Sacramento MD      Treadmill   MPH  3.5    Grade  0    Minutes  15    METs  3.68      Elliptical   Level  2    Speed  2.7    Minutes  15      T5 Nustep   Level  4    SPM  100    Minutes  15    METs  3      Prescription Details   Frequency (times per week)  3    Duration  Progress to 45 minutes of aerobic exercise without signs/symptoms of physical distress      Intensity   THRR 40-80% of Max Heartrate  109-155    Ratings of Perceived Exertion  11-13    Perceived Dyspnea  0-4      Progression   Progression  Continue to progress workloads to maintain intensity without signs/symptoms of physical distress.      Resistance Training   Training Prescription  Yes    Weight  5 lbs    Reps  10-15       Discharge Exercise Prescription (Final Exercise Prescription Changes): Exercise Prescription Changes - 04/30/17 1600      Response to Exercise   Blood Pressure (Admit)  122/62    Blood Pressure (Exercise)  146/64    Blood Pressure (Exit)  104/68    Heart Rate (Admit)  84 bpm    Heart Rate (Exercise)  159 bpm    Heart Rate (Exit)  81 bpm    Rating of Perceived Exertion (Exercise)  15    Symptoms  none    Duration  Continue with 45 min of aerobic exercise without signs/symptoms of physical distress.    Intensity  THRR unchanged      Progression   Progression  Continue to progress workloads to maintain intensity without signs/symptoms of physical distress.    Average METs  4.34      Resistance Training   Training Prescription  Yes    Weight  10 lbs    Reps  10-15      Interval Training   Interval Training  Yes    Equipment  Elliptical;Recumbant Elliptical    Comments  60mn off 30sec on      Treadmill   MPH  3.5  Grade  0    Minutes  15    METs  3.68      Elliptical   Level  3    Speed  5 interval peak 17 mph    Minutes  15      T5 Nustep   Level  6    Minutes  15    METs  5      Home Exercise Plan   Plans to continue exercise at  Home (comment) treadmill    Frequency  Add 3 additional days to program exercise sessions.    Initial Home Exercises Provided  04/04/17       Functional Capacity: 6 Minute Walk    Row Name 03/10/17 1131 05/09/17 0756       6 Minute Walk   Phase  Initial  Discharge    Distance  1888 feet  2130 feet    Distance % Change  -  12.8 %    Distance Feet Change  -  242 ft    Walk Time  6 minutes  6 minutes    # of Rest Breaks  0  0    MPH  3.58  4.03    METS  5.55  5.86    RPE  11  11    VO2 Peak  19.43  20.5    Symptoms  No  No    Resting HR  63 bpm  64 bpm    Resting BP  134/64  114/70    Resting Oxygen Saturation   99 %  -    Exercise Oxygen Saturation  during 6 min walk  99 %  -    Max Ex. HR  98 bpm  109 bpm    Max Ex. BP  154/70  126/64    2 Minute Post BP  120/60  -       Psychological, QOL, Others - Outcomes: PHQ 2/9: Depression screen  [facility] 2/9 05/09/2017 03/10/2017  Decreased Interest 0 0  Down, Depressed, Hopeless 0 0  PHQ - 2 Score 0 0  Altered sleeping 0 0  Tired, decreased energy 0 0  Change in appetite 0 0  Feeling bad or failure about yourself  0 0  Trouble concentrating 0 0  Moving slowly or fidgety/restless 0 0  Suicidal thoughts 0 0  PHQ-9 Score 0 0  Difficult doing work/chores Not difficult at all Not difficult at all    Quality of Life: Quality of Life - 05/09/17 0759      Quality of Life Scores   Health/Function Pre  28.43 %    Health/Function Post  29.67 %    Health/Function % Change  4.36 %    Socioeconomic Pre  25.83 %    Socioeconomic Post  27.14 %    Socioeconomic % Change   5.07 %    Psych/Spiritual Pre  29.17 %    Psych/Spiritual Post  28.93 %    Psych/Spiritual % Change  -0.82 %    Family Pre  23.88 %    Family Post  26 %    Family % Change  8.88 %    GLOBAL Pre  27.48 %    GLOBAL Post  28.53 %    GLOBAL % Change  3.82 %       Personal Goals: Goals established at orientation with interventions provided to work toward goal. Personal Goals and Risk Factors at Admission - 03/10/17 1201      Core Components/Risk  Factors/Patient Goals on Admission    Weight Management  Yes    Intervention  Weight Management: Develop a combined nutrition and exercise program designed to reach desired caloric intake, while maintaining appropriate intake of nutrient and fiber, sodium and fats, and appropriate energy expenditure required for the weight goal.;Weight Management: Provide education and appropriate resources to help participant work on and attain dietary goals.;Weight Management/Obesity: Establish reasonable short term and long term weight goals.;Obesity: Provide education and appropriate resources to help participant work on and attain dietary goals.    Admit Weight  229 lb 8 oz (104.1 kg)    Goal Weight: Short Term  220 lb (99.8 kg)    Goal Weight: Long Term  200 lb (90.7 kg)    Expected Outcomes   Short Term: Continue to assess and modify interventions until short term weight is achieved;Long Term: Adherence to nutrition and physical activity/exercise program aimed toward attainment of established weight goal;Weight Maintenance: Understanding of the daily nutrition guidelines, which includes 25-35% calories from fat, 7% or less cal from saturated fats, less than 254m cholesterol, less than 1.5gm of sodium, & 5 or more servings of fruits and vegetables daily;Weight Loss: Understanding of general recommendations for a balanced deficit meal plan, which promotes 1-2 lb weight loss per week and includes a negative energy balance of [phone] kcal/d;Understanding recommendations for meals to include 15-35% energy as protein, 25-35% energy from fat, 35-60% energy from carbohydrates, less than 2011mof dietary cholesterol, 20-35 gm of total fiber daily;Understanding of distribution of calorie intake throughout the day with the consumption of 4-5 meals/snacks    Tobacco Cessation  Yes    Number of packs per day  -- does not smoke, only chews tobacco    Intervention  Assist the participant in steps to quit. Provide individualized education and counseling about committing to Tobacco Cessation, relapse prevention, and pharmacological support that can be provided by physician.;OfAdvice workerassist with locating and accessing local/national Quit Smoking programs, and support quit date choice. materials offered at initial med review, patient declined intormation and is not ready to quit.     Expected Outcomes  Short Term: Will demonstrate readiness to quit, by selecting a quit date.;Short Term: Will quit all tobacco product use, adhering to prevention of relapse plan.;Long Term: Complete abstinence from all tobacco products for at least 12 months from quit date.    Hypertension  Yes    Intervention  Provide education on lifestyle modifcations including regular physical activity/exercise, weight  management, moderate sodium restriction and increased consumption of fresh fruit, vegetables, and low fat dairy, alcohol moderation, and smoking cessation.;Monitor prescription use compliance.    Expected Outcomes  Short Term: Continued assessment and intervention until BP is < 140/9038mG in hypertensive participants. < 130/66m53m in hypertensive participants with diabetes, heart failure or chronic kidney disease.;Long Term: Maintenance of blood pressure at goal levels.    Lipids  Yes    Intervention  Provide education and support for participant on nutrition & aerobic/resistive exercise along with prescribed medications to achieve LDL <70mg78mL >40mg.70mExpected Outcomes  Short Term: Participant states understanding of desired cholesterol values and is compliant with medications prescribed. Participant is following exercise prescription and nutrition guidelines.;Long Term: Cholesterol controlled with medications as prescribed, with individualized exercise RX and with personalized nutrition plan. Value goals: LDL < 70mg, 24m> 40 mg.    Stress  Yes    Intervention  Offer individual and/or small group education and counseling on  adjustment to heart disease, stress management and health-related lifestyle change. Teach and support self-help strategies.;Refer participants experiencing significant psychosocial distress to appropriate mental health specialists for further evaluation and treatment. When possible, include family members and significant others in education/counseling sessions.    Expected Outcomes  Short Term: Participant demonstrates changes in health-related behavior, relaxation and other stress management skills, ability to obtain effective social support, and compliance with psychotropic medications if prescribed.;Long Term: Emotional wellbeing is indicated by absence of clinically significant psychosocial distress or social isolation.        Personal Goals Discharge: Goals and Risk  Factor Review    Row Name 04/18/17 0924             Core Components/Risk Factors/Patient Goals Review   Personal Goals Review  Weight Management/Obesity;Stress       Review  Victor Ibarra state she has no stress.  He is following the heart healthy plan from my fitness pal app and is reducing sodium and fat.  He states he is "stuck" at 219 lb and cant lose more.  He is down from 260 in July.  He is meeting with the RD 12/19.         Expected Outcomes  Short - Victor Ibarra will meet with the RD and follow her guidlines.  Long - Victor Ibarra will reach his weight goal of 200 lb.           Exercise Goals and Review: Exercise Goals    Row Name 03/10/17 1140             Exercise Goals   Increase Physical Activity  Yes       Intervention  Provide advice, education, support and counseling about physical activity/exercise needs.;Develop an individualized exercise prescription for aerobic and resistive training based on initial evaluation findings, risk stratification, comorbidities and participant's personal goals.       Expected Outcomes  Achievement of increased cardiorespiratory fitness and enhanced flexibility, muscular endurance and strength shown through measurements of functional capacity and personal statement of participant.       Increase Strength and Stamina  Yes       Intervention  Develop an individualized exercise prescription for aerobic and resistive training based on initial evaluation findings, risk stratification, comorbidities and participant's personal goals.;Provide advice, education, support and counseling about physical activity/exercise needs.       Expected Outcomes  Achievement of increased cardiorespiratory fitness and enhanced flexibility, muscular endurance and strength shown through measurements of functional capacity and personal statement of participant.       Able to understand and use rate of perceived exertion (RPE) scale  Yes       Intervention  Provide education and  explanation on how to use RPE scale       Expected Outcomes  Short Term: Able to use RPE daily in rehab to express subjective intensity level;Long Term:  Able to use RPE to guide intensity level when exercising independently       Knowledge and understanding of Target Heart Rate Range (THRR)  Yes       Intervention  Provide education and explanation of THRR including how the numbers were predicted and where they are located for reference       Expected Outcomes  Short Term: Able to state/look up THRR;Long Term: Able to use THRR to govern intensity when exercising independently;Short Term: Able to use daily as guideline for intensity in rehab       Able to check pulse independently  Yes       Intervention  Provide education and demonstration on how to check pulse in carotid and radial arteries.;Review the importance of being able to check your own pulse for safety during independent exercise       Expected Outcomes  Short Term: Able to explain why pulse checking is important during independent exercise;Long Term: Able to check pulse independently and accurately       Understanding of Exercise Prescription  Yes       Intervention  Provide education, explanation, and written materials on patient's individual exercise prescription       Expected Outcomes  Short Term: Able to explain program exercise prescription;Long Term: Able to explain home exercise prescription to exercise independently          Nutrition & Weight - Outcomes: Pre Biometrics - 03/10/17 1140      Pre Biometrics   Height  6' 1.8" (1.875 m)    Weight  229 lb 8 oz (104.1 kg)    Waist Circumference  36 inches    Hip Circumference  43 inches    Waist to Hip Ratio  0.84 %    BMI (Calculated)  29.61    Single Leg Stand  30 seconds      Post Biometrics - 05/09/17 1010       Post  Biometrics   Height  6' 1.8" (1.875 m)    Weight  225 lb (102.1 kg)    Waist Circumference  35.5 inches    Hip Circumference  43 inches    Waist to  Hip Ratio  0.83 %    BMI (Calculated)  29.03    Single Leg Stand  30 seconds       Nutrition: Nutrition Therapy & Goals - 03/10/17 1206      Intervention Plan   Intervention  Prescribe, educate and counsel regarding individualized specific dietary modifications aiming towards targeted core components such as weight, hypertension, lipid management, diabetes, heart failure and other comorbidities.;Nutrition handout(s) given to patient.    Expected Outcomes  Short Term Goal: A plan has been developed with personal nutrition goals set during dietitian appointment.;Long Term Goal: Adherence to prescribed nutrition plan.;Short Term Goal: Understand basic principles of dietary content, such as calories, fat, sodium, cholesterol and nutrients.       Nutrition Discharge: Nutrition Assessments - 05/09/17 0759      MEDFICTS Scores   Pre Score  26    Post Score  10    Score Difference  -16       Education Questionnaire Score: Knowledge Questionnaire Score - 05/09/17 0758      Knowledge Questionnaire Score   Pre Score  23/28    Post Score  26/28 results reviewed with pt       Goals reviewed with patient; copy given to patient.

## 2017-05-09 NOTE — Patient Instructions (Signed)
Discharge Instructions  Patient Details  Name: Victor Ibarra. MRN: [id number] Date of Birth: Mar 27, 1976 Referring Provider:  Wellington Hampshire, MD   Number of Visits: 24/36   Reason for Discharge:  Patient reached a stable level of exercise. Patient independent in their exercise. Patient has met program and personal goals. 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 Chews tobacco, offered cessation materials, patient declined stating he is not ready to quit chewing tobacco at this time since it is his livelyhood.     Diagnosis:  NSTEMI (non-ST elevated myocardial infarction) [facility])  Status post coronary artery stent placement  Initial Exercise Prescription: Initial Exercise Prescription - 03/10/17 1100      Date of Initial Exercise RX and Referring Provider   Date  03/10/17    Referring Provider  Kathlyn Sacramento MD      Treadmill   MPH  3.5    Grade  0    Minutes  15    METs  3.68      Elliptical   Level  2    Speed  2.7    Minutes  15      T5 Nustep   Level  4    SPM  100    Minutes  15    METs  3      Prescription Details   Frequency (times per week)  3    Duration  Progress to 45 minutes of aerobic exercise without signs/symptoms of physical distress      Intensity   THRR 40-80% of Max Heartrate  109-155    Ratings of Perceived Exertion  11-13    Perceived Dyspnea  0-4      Progression   Progression  Continue to progress workloads to maintain intensity without signs/symptoms of physical distress.      Resistance Training   Training Prescription  Yes    Weight  5 lbs    Reps  10-15       Discharge Exercise Prescription (Final Exercise Prescription Changes): Exercise Prescription Changes - 04/30/17 1600      Response to Exercise   Blood Pressure (Admit)  122/62    Blood Pressure (Exercise)  146/64    Blood Pressure (Exit)  104/68    Heart Rate (Admit)  84 bpm    Heart Rate (Exercise)  159 bpm    Heart Rate (Exit)  81 bpm    Rating of Perceived Exertion (Exercise)  15    Symptoms  none    Duration  Continue with 45 min of aerobic exercise without signs/symptoms of physical distress.    Intensity  THRR unchanged      Progression   Progression  Continue to progress workloads to maintain intensity without signs/symptoms of physical distress.    Average METs  4.34      Resistance Training   Training Prescription  Yes    Weight  10 lbs    Reps  10-15      Interval Training   Interval Training  Yes    Equipment  Elliptical;Recumbant Elliptical    Comments  64mn off 30sec on      Treadmill   MPH  3.5    Grade  0    Minutes  15    METs  3.68  Elliptical   Level  3    Speed  5 interval peak 17 mph    Minutes  15      T5 Nustep   Level  6    Minutes  15    METs  5      Home Exercise Plan   Plans to continue exercise at  Home (comment) treadmill    Frequency  Add 3 additional days to program exercise sessions.    Initial Home Exercises Provided  04/04/17       Functional Capacity: 6 Minute Walk    Row Name 03/10/17 1131 05/09/17 0756       6 Minute Walk   Phase  Initial  Discharge    Distance  1888 feet  2130 feet    Distance % Change  -  12.8 %    Distance Feet Change  -  242 ft    Walk Time  6 minutes  6 minutes    # of Rest Breaks  0  0    MPH  3.58  4.03    METS  5.55  5.86    RPE  11  11    VO2 Peak  19.43  20.5    Symptoms  No  No    Resting HR  63 bpm  64 bpm    Resting BP  134/64  114/70    Resting Oxygen Saturation   99 %  -    Exercise Oxygen Saturation  during 6 min walk  99 %  -    Max Ex. HR  98 bpm  109 bpm    Max Ex. BP  154/70  126/64    2 Minute Post BP  120/60  -       Quality of Life: Quality of Life - 05/09/17 0759      Quality of Life Scores   Health/Function Pre  28.43 %     Health/Function Post  29.67 %    Health/Function % Change  4.36 %    Socioeconomic Pre  25.83 %    Socioeconomic Post  27.14 %    Socioeconomic % Change   5.07 %    Psych/Spiritual Pre  29.17 %    Psych/Spiritual Post  28.93 %    Psych/Spiritual % Change  -0.82 %    Family Pre  23.88 %    Family Post  26 %    Family % Change  8.88 %    GLOBAL Pre  27.48 %    GLOBAL Post  28.53 %    GLOBAL % Change  3.82 %       Personal Goals: Goals established at orientation with interventions provided to work toward goal. Personal Goals and Risk Factors at Admission - 03/10/17 1201      Core Components/Risk Factors/Patient Goals on Admission    Weight Management  Yes    Intervention  Weight Management: Develop a combined nutrition and exercise program designed to reach desired caloric intake, while maintaining appropriate intake of nutrient and fiber, sodium and fats, and appropriate energy expenditure required for the weight goal.;Weight Management: Provide education and appropriate resources to help participant work on and attain dietary goals.;Weight Management/Obesity: Establish reasonable short term and long term weight goals.;Obesity: Provide education and appropriate resources to help participant work on and attain dietary goals.    Admit Weight  229 lb 8 oz (104.1 kg)    Goal Weight: Short Term  220 lb (99.8 kg)  Goal Weight: Long Term  200 lb (90.7 kg)    Expected Outcomes  Short Term: Continue to assess and modify interventions until short term weight is achieved;Long Term: Adherence to nutrition and physical activity/exercise program aimed toward attainment of established weight goal;Weight Maintenance: Understanding of the daily nutrition guidelines, which includes 25-35% calories from fat, 7% or less cal from saturated fats, less than 261m cholesterol, less than 1.5gm of sodium, & 5 or more servings of fruits and vegetables daily;Weight Loss: Understanding of general recommendations for a  balanced deficit meal plan, which promotes 1-2 lb weight loss per week and includes a negative energy balance of [phone] kcal/d;Understanding recommendations for meals to include 15-35% energy as protein, 25-35% energy from fat, 35-60% energy from carbohydrates, less than 2045mof dietary cholesterol, 20-35 gm of total fiber daily;Understanding of distribution of calorie intake throughout the day with the consumption of 4-5 meals/snacks    Tobacco Cessation  Yes    Number of packs per day  -- does not smoke, only chews tobacco    Intervention  Assist the participant in steps to quit. Provide individualized education and counseling about committing to Tobacco Cessation, relapse prevention, and pharmacological support that can be provided by physician.;OfAdvice workerassist with locating and accessing local/national Quit Smoking programs, and support quit date choice. materials offered at initial med review, patient declined intormation and is not ready to quit.     Expected Outcomes  Short Term: Will demonstrate readiness to quit, by selecting a quit date.;Short Term: Will quit all tobacco product use, adhering to prevention of relapse plan.;Long Term: Complete abstinence from all tobacco products for at least 12 months from quit date.    Hypertension  Yes    Intervention  Provide education on lifestyle modifcations including regular physical activity/exercise, weight management, moderate sodium restriction and increased consumption of fresh fruit, vegetables, and low fat dairy, alcohol moderation, and smoking cessation.;Monitor prescription use compliance.    Expected Outcomes  Short Term: Continued assessment and intervention until BP is < 140/9052mG in hypertensive participants. < 130/52m52m in hypertensive participants with diabetes, heart failure or chronic kidney disease.;Long Term: Maintenance of blood pressure at goal levels.    Lipids  Yes    Intervention  Provide education and  support for participant on nutrition & aerobic/resistive exercise along with prescribed medications to achieve LDL <70mg65mL >40mg.64mExpected Outcomes  Short Term: Participant states understanding of desired cholesterol values and is compliant with medications prescribed. Participant is following exercise prescription and nutrition guidelines.;Long Term: Cholesterol controlled with medications as prescribed, with individualized exercise RX and with personalized nutrition plan. Value goals: LDL < 70mg, 58m> 40 mg.    Stress  Yes    Intervention  Offer individual and/or small group education and counseling on adjustment to heart disease, stress management and health-related lifestyle change. Teach and support self-help strategies.;Refer participants experiencing significant psychosocial distress to appropriate mental health specialists for further evaluation and treatment. When possible, include family members and significant others in education/counseling sessions.    Expected Outcomes  Short Term: Participant demonstrates changes in health-related behavior, relaxation and other stress management skills, ability to obtain effective social support, and compliance with psychotropic medications if prescribed.;Long Term: Emotional wellbeing is indicated by absence of clinically significant psychosocial distress or social isolation.        Personal Goals Discharge: Goals and Risk Factor Review - 04/18/17 0924      Core Components/Risk Factors/Patient Goals Review  Personal Goals Review  Weight Management/Obesity;Stress    Review  Herbie Baltimore state she has no stress.  He is following the heart healthy plan from my fitness pal app and is reducing sodium and fat.  He states he is "stuck" at 219 lb and cant lose more.  He is down from 260 in July.  He is meeting with the RD 12/19.      Expected Outcomes  Short - Herbie Baltimore will meet with the RD and follow her guidlines.  Long - Herbie Baltimore will reach his weight goal of  200 lb.        Exercise Goals and Review: Exercise Goals    Row Name 03/10/17 1140             Exercise Goals   Increase Physical Activity  Yes       Intervention  Provide advice, education, support and counseling about physical activity/exercise needs.;Develop an individualized exercise prescription for aerobic and resistive training based on initial evaluation findings, risk stratification, comorbidities and participant's personal goals.       Expected Outcomes  Achievement of increased cardiorespiratory fitness and enhanced flexibility, muscular endurance and strength shown through measurements of functional capacity and personal statement of participant.       Increase Strength and Stamina  Yes       Intervention  Develop an individualized exercise prescription for aerobic and resistive training based on initial evaluation findings, risk stratification, comorbidities and participant's personal goals.;Provide advice, education, support and counseling about physical activity/exercise needs.       Expected Outcomes  Achievement of increased cardiorespiratory fitness and enhanced flexibility, muscular endurance and strength shown through measurements of functional capacity and personal statement of participant.       Able to understand and use rate of perceived exertion (RPE) scale  Yes       Intervention  Provide education and explanation on how to use RPE scale       Expected Outcomes  Short Term: Able to use RPE daily in rehab to express subjective intensity level;Long Term:  Able to use RPE to guide intensity level when exercising independently       Knowledge and understanding of Target Heart Rate Range (THRR)  Yes       Intervention  Provide education and explanation of THRR including how the numbers were predicted and where they are located for reference       Expected Outcomes  Short Term: Able to state/look up THRR;Long Term: Able to use THRR to govern intensity when exercising  independently;Short Term: Able to use daily as guideline for intensity in rehab       Able to check pulse independently  Yes       Intervention  Provide education and demonstration on how to check pulse in carotid and radial arteries.;Review the importance of being able to check your own pulse for safety during independent exercise       Expected Outcomes  Short Term: Able to explain why pulse checking is important during independent exercise;Long Term: Able to check pulse independently and accurately       Understanding of Exercise Prescription  Yes       Intervention  Provide education, explanation, and written materials on patient's individual exercise prescription       Expected Outcomes  Short Term: Able to explain program exercise prescription;Long Term: Able to explain home exercise prescription to exercise independently          Nutrition & Weight -  Outcomes: Pre Biometrics - 03/10/17 1140      Pre Biometrics   Height  6' 1.8" (1.875 m)    Weight  229 lb 8 oz (104.1 kg)    Waist Circumference  36 inches    Hip Circumference  43 inches    Waist to Hip Ratio  0.84 %    BMI (Calculated)  29.61    Single Leg Stand  30 seconds        Nutrition: Nutrition Therapy & Goals - 03/10/17 1206      Intervention Plan   Intervention  Prescribe, educate and counsel regarding individualized specific dietary modifications aiming towards targeted core components such as weight, hypertension, lipid management, diabetes, heart failure and other comorbidities.;Nutrition handout(s) given to patient.    Expected Outcomes  Short Term Goal: A plan has been developed with personal nutrition goals set during dietitian appointment.;Long Term Goal: Adherence to prescribed nutrition plan.;Short Term Goal: Understand basic principles of dietary content, such as calories, fat, sodium, cholesterol and nutrients.       Nutrition Discharge: Nutrition Assessments - 05/09/17 0759      MEDFICTS Scores   Pre  Score  26    Post Score  10    Score Difference  -16       Education Questionnaire Score: Knowledge Questionnaire Score - 05/09/17 0758      Knowledge Questionnaire Score   Pre Score  23/28    Post Score  26/28 results reviewed with pt       Goals reviewed with patient; copy given to patient.

## 2017-09-14 ENCOUNTER — Encounter: Payer: Self-pay | Admitting: Cardiovascular Disease

## 2017-10-06 ENCOUNTER — Encounter: Payer: Self-pay | Admitting: Nurse Practitioner

## 2017-10-06 ENCOUNTER — Ambulatory Visit (INDEPENDENT_AMBULATORY_CARE_PROVIDER_SITE_OTHER): Payer: BLUE CROSS/BLUE SHIELD | Admitting: Nurse Practitioner

## 2017-10-06 VITALS — BP 120/74 | HR 67 | Ht 72.0 in | Wt 208.0 lb

## 2017-10-06 DIAGNOSIS — Z13220 Encounter for screening for lipoid disorders: Secondary | ICD-10-CM | POA: Diagnosis not present

## 2017-10-06 DIAGNOSIS — I25119 Atherosclerotic heart disease of native coronary artery with unspecified angina pectoris: Secondary | ICD-10-CM | POA: Diagnosis not present

## 2017-10-06 DIAGNOSIS — R079 Chest pain, unspecified: Secondary | ICD-10-CM

## 2017-10-06 NOTE — Progress Notes (Signed)
Office Visit    Patient Name: Victor BillWilliam Robert Lindroth Jr. Date of Encounter: 10/06/2017  Primary Care Provider:  Patient, No Pcp Per Primary Cardiologist:  Lorine BearsMuhammad Arida, MD  Chief Complaint    42 year old male with a history of CAD status post non-STEMI in July 2018 with stenting of the LAD, who presents for follow-up.  Past Medical History    Past Medical History:  Diagnosis Date  . CAD (coronary artery disease)    a. 11/2016 NSTEMI/PCI: LAD 60p w/ bridge.  FFR 0.81-->PCI performed 2/2 ACS/presumed plaque rupture (2.75x18 Xience Alpine DES). OTW nl cors, EF 55-65%.  . MVP (mitral valve prolapse)    a. 11/2014 Echo: EF 55-60%, mild LVh, inferolateral HK, mild MVP, mild MR, nl RV fxn.  . Obesity   . [facility]    a. Dx 09/2016 - s/p 2 wks of doxycycline.  . Tick bite    Past Surgical History:  Procedure Laterality Date  . CORONARY STENT INTERVENTION N/A 11/19/2016   Procedure: Coronary Stent Intervention;  Surgeon: Yvonne KendallEnd, Amarah Brossman, MD;  Location: [facility] INVASIVE CV LAB;  Service: Cardiovascular;  Laterality: N/A;  . INTRAVASCULAR PRESSURE WIRE/FFR STUDY N/A 11/19/2016   Procedure: Intravascular Pressure Wire/FFR Study;  Surgeon: Yvonne KendallEnd, Markos Theil, MD;  Location: [facility] INVASIVE CV LAB;  Service: Cardiovascular;  Laterality: N/A;  . LEFT HEART CATH AND CORONARY ANGIOGRAPHY N/A 11/19/2016   Procedure: Left Heart Cath and Coronary Angiography;  Surgeon: Yvonne KendallEnd, Breelle Hollywood, MD;  Location: [facility] INVASIVE CV LAB;  Service: Cardiovascular;  Laterality: N/A;    Allergies  No Known Allergies  History of Present Illness    42 year old male with the above past medical history including coronary artery disease status post non-STEMI July 2018.  Catheterization at that time showed moderate proximal LAD disease with myocardial bridging and evidence of plaque rupture.  He underwent successful PCI and drug-eluting stent placement.  LV function was normal.  He has been managed with  aspirin, statin, beta-blocker, and Brilinta therapy.  He was last seen in clinic in late 2018.  He says that since his last visit, he has mostly done well.  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  He is working as much as 6 hours/day and as many as 14 hours per working day.  There is a lot of heavy exertion and he is typically able to tolerate this just fine but he does note on some days that he has less energy than others.  He also notes occasional episodes of tightness across his chest without associated symptoms.  This could last several minutes or even hours and resolve spontaneously.  Discomfort does not limit his activity.  He says it does make him somewhat nervous as he does not really know what to expect.  He has never taken a nitroglycerin.  He denies PND, orthopnea, dizziness, syncope, palpitations, edema, or early satiety.  Home Medications    Prior to Admission medications   Medication Sig Start Date End Date Taking? Authorizing Provider  aspirin 81 MG chewable tablet Chew 1 tablet (81 mg total) by mouth daily. 11/20/16  Yes Enid BaasKalisetti, Radhika, MD  atorvastatin (LIPITOR) 80 MG tablet Take 1 tablet (80 mg total) by mouth daily at 6 PM. 03/06/17  Yes Iran OuchArida, Muhammad A, MD  metoprolol succinate (TOPROL XL) 25 MG 24 hr tablet Take 1 tablet (25 mg total) by mouth daily. 03/06/17 03/06/18 Yes Iran OuchArida, Muhammad A, MD  nitroGLYCERIN (NITROSTAT) 0.4 MG SL tablet Place 1  tablet (0.4 mg total) under the tongue every 5 (five) minutes as needed for chest pain. 11/20/16  Yes Enid Baas, MD  ticagrelor (BRILINTA) 90 MG TABS tablet Take 1 tablet (90 mg total) by mouth 2 (two) times daily. 03/06/17  Yes Iran Ouch, MD    Review of Systems    Occasional chest tightness as outlined above.  Also sometimes fatigue though he is been working some very long hours..  All other systems reviewed and are otherwise negative except as noted above.  Physical Exam    VS:  BP 120/74 (BP Location:  Left Arm, Patient Position: Sitting, Cuff Size: Normal)   Pulse 67   Ht 6' (1.829 m)   Wt 208 lb (94.3 kg)   BMI 28.21 kg/m  , BMI Body mass index is 28.21 kg/m. GEN: Well nourished, well developed, in no acute distress.  HEENT: normal.  Neck: Supple, no JVD, carotid bruits, or masses. Cardiac: RRR, no murmurs, rubs, or gallops. No clubbing, cyanosis, edema.  Radials/DP/PT 2+ and equal bilaterally.  Respiratory:  Respirations regular and unlabored, clear to auscultation bilaterally. GI: Soft, nontender, nondistended, BS + x 4. MS: no deformity or atrophy. Skin: warm and dry, no rash. Neuro:  Strength and sensation are intact. Psych: Normal affect.  Accessory Clinical Findings    ECG - regular sinus rhythm, 67, inferior T wave inversion-similar to prior ECG.  Assessment & Plan    1.  Coronary artery disease/chest pain: Patient with prior non-STEMI in July 2018 and LAD stenting.  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  It does cause him some degree of stress however.  I think it is reasonable to evaluate him with an exercise Myoview to rule out ischemia.  Hopefully if this is normal, I will provide him some reassurance as this does weigh on him some.  He remains on aspirin, statin, beta-blocker, and Brilinta therapy.  He will hit 1 year of Brilinta therapy in July.  Would likely plan to reduce his dose to 60 mg twice daily at that point.  2.  Lipids: Last LDL in August 2018 was 32.  LFTs were normal at that time.  As he will be n.p.o. for coming stress test, I will also arrange for follow-up lipids and LFTs.  3.  History of obesity: Patient has lost significant amount of weight.  He is down to 208 pounds.  He was 260 at the time of his event.  I congratulated him on this.  He has been using the my fitness pal application on his phone and just trying to exercise more.  4.   Disposition: Follow-up lipids, LFTs, and stress testing as above.  Follow-up in clinic in 1-2 months or sooner if necessary.  Nicolasa Ducking, NP 10/06/2017, 5:55 PM

## 2017-10-06 NOTE — Patient Instructions (Addendum)
Medication Instructions:  Your physician recommends that you continue on your current medications as directed. Please refer to the Current Medication list given to you today.   Labwork: Your physician recommends that you return for lab work in: at your earliest convenience.   - Please go to the [facility]. You will check in at the front desk to the right as you walk into the atrium. Valet Parking is offered if needed. - You will need to be fasting.  Testing/Procedures: Your physician has requested that you have en exercise stress myoview. For further information please visit [email or web address] Please follow instruction sheet, as given.  [facility] MYOVIEW  Your caregiver has ordered a Stress Test with nuclear imaging. The purpose of this test is to evaluate the blood supply to your heart muscle. This procedure is referred to as a "Non-Invasive Stress Test." This is because other than having an IV started in your vein, nothing is inserted or "invades" your body. Cardiac stress tests are done to find areas of poor blood flow to the heart by determining the extent of coronary artery disease (CAD). Some patients exercise on a treadmill, which naturally increases the blood flow to your heart, while others who are  unable to walk on a treadmill due to physical limitations have a pharmacologic/chemical stress agent called Lexiscan . This medicine will mimic walking on a treadmill by temporarily increasing your coronary blood flow.   Please note: these test may take anywhere between 2-4 hours to complete  PLEASE REPORT TO [facility] MEDICAL MALL ENTRANCE  THE VOLUNTEERS AT THE FIRST DESK WILL DIRECT YOU WHERE TO GO  Date of Procedure:_____________________________________  Arrival Time for Procedure:______________________________  Instructions regarding medication:   _XX_:  Hold betablocker(METOPROLOL) night before procedure and morning of procedure   PLEASE NOTIFY THE OFFICE AT LEAST 24 HOURS IN  ADVANCE IF YOU ARE UNABLE TO KEEP YOUR APPOINTMENT.  [phone] AND  PLEASE NOTIFY NUCLEAR MEDICINE AT [facility] AT LEAST 24 HOURS IN ADVANCE IF YOU ARE UNABLE TO KEEP YOUR APPOINTMENT. [phone]  How to prepare for your Myoview test:  1. Do not eat or drink after midnight 2. No caffeine for 24 hours prior to test 3. No smoking 24 hours prior to test. 4. Your medication may be taken with water.  If your doctor stopped a medication because of this test, do not take that medication. 5. Ladies, please do not wear dresses.  Skirts or pants are appropriate. Please wear a short sleeve shirt. 6. No perfume, cologne or lotion. 7. Wear comfortable walking shoes. No heels!    Follow-Up: Your physician recommends that you schedule a follow-up appointment in: 2 MONTHS WITH DR ARIDA.    If you need a refill on your cardiac medications before your next appointment, please call your pharmacy.   Cardiac Nuclear Scan A cardiac nuclear scan is a test that measures blood flow to the heart when a person is resting and when he or she is exercising. The test looks for problems such as:  Not enough blood reaching a portion of the heart.  The heart muscle not working normally.  You may need this test if:  You have heart disease.  You have had abnormal lab results.  You have had heart surgery or angioplasty.  You have chest pain.  You have shortness of breath.  In this test, a radioactive dye (tracer) is injected into your bloodstream. After the tracer has traveled to your heart, an imaging device is used to measure  how much of the tracer is absorbed by or distributed to various areas of your heart. This procedure is usually done at a hospital and takes 2-4 hours. Tell a health care provider about:  Any allergies you have.  All medicines you are taking, including vitamins, herbs, eye drops, creams, and over-the-counter medicines.  Any problems you or family members have had with the use of  anesthetic medicines.  Any blood disorders you have.  Any surgeries you have had.  Any medical conditions you have.  Whether you are pregnant or may be pregnant. What are the risks? Generally, this is a safe procedure. However, problems may occur, including:  Serious chest pain and heart attack. This is only a risk if the stress portion of the test is done.  Rapid heartbeat.  Sensation of warmth in your chest. This usually passes quickly.  What happens before the procedure?  Ask your health care provider about changing or stopping your regular medicines. This is especially important if you are taking diabetes medicines or blood thinners.  Remove your jewelry on the day of the procedure. What happens during the procedure?  An IV tube will be inserted into one of your veins.  Your health care provider will inject a small amount of radioactive tracer through the tube.  You will wait for 20-40 minutes while the tracer travels through your bloodstream.  Your heart activity will be monitored with an electrocardiogram (ECG).  You will lie down on an exam table.  Images of your heart will be taken for about 15-20 minutes.  You may be asked to exercise on a treadmill or stationary bike. While you exercise, your heart's activity will be monitored with an ECG, and your blood pressure will be checked. If you are unable to exercise, you may be given a medicine to increase blood flow to parts of your heart.  When blood flow to your heart has peaked, a tracer will again be injected through the IV tube.  After 20-40 minutes, you will get back on the exam table and have more images taken of your heart.  When the procedure is over, your IV tube will be removed. The procedure may vary among health care providers and hospitals. Depending on the type of tracer used, scans may need to be repeated 3-4 hours later. What happens after the procedure?  Unless your health care provider tells you  otherwise, you may return to your normal schedule, including diet, activities, and medicines.  Unless your health care provider tells you otherwise, you may increase your fluid intake. This will help flush the contrast dye from your body. Drink enough fluid to keep your urine clear or pale yellow.  It is up to you to get your test results. Ask your health care provider, or the department that is doing the test, when your results will be ready. Summary  A cardiac nuclear scan measures the blood flow to the heart when a person is resting and when he or she is exercising.  You may need this test if you are at risk for heart disease.  Tell your health care provider if you are pregnant.  Unless your health care provider tells you otherwise, increase your fluid intake. This will help flush the contrast dye from your body. Drink enough fluid to keep your urine clear or pale yellow. This information is not intended to replace advice given to you by your health care provider. Make sure you discuss any questions you have with your  health care provider. Document Released: 05/17/2004 Document Revised: 04/24/2016 Document Reviewed: 03/31/2013 Elsevier Interactive Patient Education  2017 [employer]

## 2017-10-08 ENCOUNTER — Other Ambulatory Visit
Admission: RE | Admit: 2017-10-08 | Discharge: 2017-10-08 | Disposition: A | Discharge status: Home / Self Care | Payer: BLUE CROSS/BLUE SHIELD | Source: Ambulatory Visit | Attending: Nurse Practitioner | Admitting: Nurse Practitioner

## 2017-10-08 ENCOUNTER — Encounter
Admission: RE | Admit: 2017-10-08 | Discharge: 2017-10-08 | Disposition: A | Discharge status: Home / Self Care | Payer: BLUE CROSS/BLUE SHIELD | Source: Ambulatory Visit | Attending: Nurse Practitioner | Admitting: Nurse Practitioner

## 2017-10-08 DIAGNOSIS — Z13220 Encounter for screening for lipoid disorders: Secondary | ICD-10-CM

## 2017-10-08 DIAGNOSIS — I25119 Atherosclerotic heart disease of native coronary artery with unspecified angina pectoris: Secondary | ICD-10-CM | POA: Diagnosis present

## 2017-10-08 DIAGNOSIS — R079 Chest pain, unspecified: Secondary | ICD-10-CM

## 2017-10-08 LAB — LIPID PANEL
Cholesterol: 84 mg/dL (ref 0–200)
HDL: 41 mg/dL (ref >40)
LDL CALC: 38 mg/dL (ref 0–99)
Total CHOL/HDL Ratio: 2 RATIO
Triglycerides: 27 mg/dL (ref <150)
VLDL: 5 mg/dL (ref 0–40)

## 2017-10-08 LAB — NM MYOCAR MULTI W/SPECT W/WALL MOTION / EF
CHL CUP NUCLEAR SDS: 0
CHL CUP NUCLEAR SSS: 1
Estimated workload: 13.7 METS
Exercise duration (min): 11 min
Exercise duration (sec): 44 s
LV dias vol: 97 mL (ref 62–150)
LVSYSVOL: 36 mL
NUC STRESS TID: 0.85
Peak HR: 155 {beats}/min
Percent HR: 87 %
Rest HR: 55 {beats}/min
SRS: 5

## 2017-10-08 LAB — HEPATIC FUNCTION PANEL
ALBUMIN: 4.3 g/dL (ref 3.5–5.0)
ALK PHOS: 35 U/L — AB (ref 38–126)
ALT: 31 U/L (ref 17–63)
AST: 26 U/L (ref 15–41)
Bilirubin, Direct: 0.1 mg/dL (ref 0.1–0.5)
Indirect Bilirubin: 0.5 mg/dL (ref 0.3–0.9)
Total Bilirubin: 0.6 mg/dL (ref 0.3–1.2)
Total Protein: 7.1 g/dL (ref 6.5–8.1)

## 2017-10-08 MED ORDER — TECHNETIUM TC 99M TETROFOSMIN IV KIT
13.2200 | PACK | Freq: Once | INTRAVENOUS | Status: AC | PRN
  Administered 2017-10-08: 13.22 via INTRAVENOUS

## 2017-10-08 MED ORDER — TECHNETIUM TC 99M TETROFOSMIN IV KIT
32.1200 | PACK | Freq: Once | INTRAVENOUS | Status: AC | PRN
  Administered 2017-10-08: 32.12 via INTRAVENOUS

## 2017-12-18 ENCOUNTER — Ambulatory Visit: Payer: BLUE CROSS/BLUE SHIELD | Admitting: Cardiovascular Disease

## 2017-12-18 ENCOUNTER — Encounter: Payer: Self-pay | Admitting: Cardiovascular Disease

## 2017-12-18 VITALS — BP 120/84 | HR 57 | Ht 72.0 in | Wt 202.8 lb

## 2017-12-18 DIAGNOSIS — I25118 Atherosclerotic heart disease of native coronary artery with other forms of angina pectoris: Secondary | ICD-10-CM | POA: Diagnosis not present

## 2017-12-18 DIAGNOSIS — I214 Non-ST elevation (NSTEMI) myocardial infarction: Secondary | ICD-10-CM | POA: Diagnosis not present

## 2017-12-18 DIAGNOSIS — E785 Hyperlipidemia, unspecified: Secondary | ICD-10-CM | POA: Insufficient documentation

## 2017-12-18 DIAGNOSIS — E782 Mixed hyperlipidemia: Secondary | ICD-10-CM

## 2017-12-18 MED ORDER — TICAGRELOR 60 MG PO TABS: 60.0000 mg | ORAL_TABLET | Freq: Two times a day (BID) | ORAL | 3 refills | Status: DC

## 2017-12-18 MED ORDER — NITROGLYCERIN 0.4 MG SL SUBL: 0.4000 mg | SUBLINGUAL_TABLET | SUBLINGUAL | 2 refills | Status: DC | PRN

## 2017-12-18 NOTE — Patient Instructions (Addendum)
Medication Instructions:   Please decrease the brilinta down to 60 mg twice a day  Labwork:  No new labs needed  Testing/Procedures:  No further testing at this time   Follow-Up: It was a pleasure seeing you in the office today. Please call us if you have new issues that need to be addressed before your next appt.  [phone]  Your physician wants you to follow-up in: 12 months.  You will receive a reminder letter in the mail two months in advance. If you don't receive a letter, please call our office to schedule the follow-up appointment.  If you need a refill on your cardiac medications before your next appointment, please call your pharmacy.  For educational health videos Log in to : [email or web address] Or : [email or web address], password : [email or web address]

## 2017-12-18 NOTE — Progress Notes (Signed)
Cardiology Office Note  Date:  12/18/2017   ID:  Victor BillWilliam Robert Flanagan Jr., DOB 1975-08-28, MRN [id number]  PCP:  Patient, No Pcp Per   Chief Complaint  Patient presents with  . Other    [facility]. patient denies chest pain and SOB. Meds reviewed verbally with patient.     HPI:  42 year old male with a history of  CAD  status post non-STEMI in July 2018 with stenting of the LAD,  who presents for follow-up of his coronary artery disease  status post non-STEMI July 2018.   Catheterization at that time showed moderate proximal LAD disease with myocardial bridging and evidence of plaque rupture.  He underwent successful PCI and drug-eluting stent placement.  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  PSH:    Past Surgical History:  Procedure Laterality Date  . CORONARY STENT INTERVENTION N/A 11/19/2016   Procedure: Coronary Stent Intervention;  Surgeon: Victor Ibarra, Christopher, MD;  Location: [facility] INVASIVE CV LAB;  Service: Cardiovascular;  Laterality: N/A;  . INTRAVASCULAR PRESSURE WIRE/FFR STUDY N/A 11/19/2016   Procedure: Intravascular Pressure Wire/FFR Study;  Surgeon: Victor Ibarra, Christopher, MD;  Location: [facility] INVASIVE CV LAB;  Service: Cardiovascular;  Laterality: N/A;  . LEFT HEART CATH AND CORONARY ANGIOGRAPHY N/A 11/19/2016   Procedure: Left Heart Cath and Coronary Angiography;  Surgeon: Victor Ibarra, Christopher, MD;  Location: [facility] INVASIVE CV LAB;  Service: Cardiovascular;  Laterality: N/A;     Current Outpatient Medications  Medication Sig Dispense Refill  . aspirin 81 MG chewable tablet Chew 1 tablet (81 mg total) by mouth daily. 30 tablet 2  . atorvastatin (LIPITOR) 80 MG tablet Take 1 tablet (80 mg total) by mouth daily at 6 PM. 90 tablet 3  . metoprolol succinate (TOPROL XL) 25 MG 24 hr tablet Take 1 tablet (25 mg total) by mouth daily. 90 tablet 3  . nitroGLYCERIN (NITROSTAT) 0.4 MG SL tablet Place 1 tablet (0.4 mg total) under the tongue every 5 (five) minutes as needed for chest pain. 25 tablet 2  . ticagrelor (BRILINTA) 60 MG TABS tablet Take 1 tablet (60 mg total) by mouth 2 (two) times daily. 180 tablet 3   No current facility-administered medications for this visit.      Allergies:   Patient has no known allergies.   Social History:  The patient  reports that he has quit smoking. His smokeless tobacco use includes chew. He reports that he drinks alcohol. He reports that he does not use drugs.   Family History:   family history includes CAD in his father; Hypertension in his mother; Melanoma in his father; Other in his mother and sister.    Review of Systems: Review of Systems  Constitutional: Negative.   Respiratory: Negative.   Cardiovascular: Negative.   Gastrointestinal: Negative.   Musculoskeletal: Negative.   Neurological: Negative.   Psychiatric/Behavioral: Negative.   All other systems reviewed and are negative.    PHYSICAL EXAM: VS:  BP 120/84 (BP Location: Left Arm, Patient Position: Sitting, Cuff Size:  Normal)   Pulse (!) 57   Ht 6' (1.829 m)   Wt 202 lb 12 oz (92 kg)   BMI 27.50 kg/m  , BMI Body mass index is 27.5 kg/m. GEN: Well nourished, well developed, in no acute distress  HEENT: normal  Neck: no JVD, carotid bruits, or masses Cardiac: RRR; no murmurs, rubs, or gallops,no edema  Respiratory:  clear to auscultation bilaterally, normal work of breathing GI: soft, nontender, nondistended, + BS MS: no deformity or atrophy  Skin:  warm and dry, no rash Neuro:  Strength and sensation are intact Psych: euthymic mood, full affect    Recent Labs: 10/08/2017: ALT 31    Lipid Panel Lab Results  Component Value Date   CHOL 84 10/08/2017   HDL 41 10/08/2017   LDLCALC 38 10/08/2017   TRIG 27 10/08/2017      Wt Readings from Last 3 Encounters:  12/18/17 202 lb 12 oz (92 kg)  10/06/17 208 lb (94.3 kg)  05/09/17 225 lb (102.1 kg)       ASSESSMENT AND PLAN:  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 No further workup at this time. Continue current medication regimen. We will change the brilinta down to 60 mg twice a day  Mixed hyperlipidemia After dramatic weight loss, weight now at goal Continue high-dose Lipitor  Disposition:   F/U  12 months   Total encounter time more than 25 minutes  Greater than 50% was spent in counseling and coordination of care with the patient    Orders Placed This Encounter  Procedures  . EKG 12-Lead     Signed, Dossie Arbourim Gollan, M.D., Ph.D. 12/18/2017  [facility], [place] [phone]

## 2017-12-19 ENCOUNTER — Ambulatory Visit: Payer: BLUE CROSS/BLUE SHIELD | Admitting: Cardiovascular Disease

## 2018-03-05 ENCOUNTER — Other Ambulatory Visit: Payer: Self-pay | Admitting: Cardiovascular Disease

## 2018-10-19 ENCOUNTER — Telehealth: Payer: Self-pay | Admitting: Cardiovascular Disease

## 2018-10-19 NOTE — Telephone Encounter (Signed)
Virtual Visit Pre-Appointment Phone Call  "(Name), I am calling you today to discuss your upcoming appointment. We are currently trying to limit exposure to the virus that causes COVID-19 by seeing patients at home rather than in the office."  1. "What is the BEST phone number to call the day of the visit?" - include this in appointment notes  2. Do you have or have access to (through a family member/friend) a smartphone with video capability that we can use for your visit?" a. If yes - list this number in appt notes as cell (if different from BEST phone #) and list the appointment type as a VIDEO visit in appointment notes b. If no - list the appointment type as a PHONE visit in appointment notes  3. Confirm consent - "In the setting of the current Covid19 crisis, you are scheduled for a (phone or video) visit with your provider on (date) at (time).  Just as we do with many in-office visits, in order for you to participate in this visit, we must obtain consent.  If you'd like, I can send this to your mychart (if signed up) or email for you to review.  Otherwise, I can obtain your verbal consent now.  All virtual visits are billed to your insurance company just like a normal visit would be.  By agreeing to a virtual visit, we'd like you to understand that the technology does not allow for your provider to perform an examination, and thus may limit your provider's ability to fully assess your condition. If your provider identifies any concerns that need to be evaluated in person, we will make arrangements to do so.  Finally, though the technology is pretty good, we cannot assure that it will always work on either your or our end, and in the setting of a video visit, we may have to convert it to a phone-only visit.  In either situation, we cannot ensure that we have a secure connection.  Are you willing to proceed?" STAFF: Did the patient verbally acknowledge consent to telehealth visit? Document  YES/NO here: yes  4. Advise patient to be prepared - "Two hours prior to your appointment, go ahead and check your blood pressure, pulse, oxygen saturation, and your weight (if you have the equipment to check those) and write them all down. When your visit starts, your provider will ask you for this information. If you have an Apple Watch or Kardia device, please plan to have heart rate information ready on the day of your appointment. Please have a pen and paper handy nearby the day of the visit as well."  5. Give patient instructions for MyChart download to smartphone OR [email or web address] as below if video visit (depending on what platform provider is using)  6. Inform patient they will receive a phone call 15 minutes prior to their appointment time (may be from unknown caller ID) so they should be prepared to answer    TELEPHONE CALL NOTE  Victor BillWilliam Robert Christner Jr. has been deemed a candidate for a follow-up tele-health visit to limit community exposure during the Covid-19 pandemic. I spoke with the patient via phone to ensure availability of phone/video source, confirm preferred email & phone number, and discuss instructions and expectations.  I reminded Victor BillWilliam Robert Macinnes Jr. to be prepared with any vital sign and/or heart rhythm information that could potentially be obtained via home monitoring, at the time of his visit. I reminded Victor BillWilliam Robert Shadowens Jr. to expect a phone  call prior to his visit.  Clarisse Gouge 10/19/2018 10:08 AM   INSTRUCTIONS FOR DOWNLOADING THE MYCHART APP TO SMARTPHONE  - The patient must first make sure to have activated MyChart and know their login information - If Apple, go to [employer] and type in MyChart in the search bar and download the app. If Android, ask patient to go to [employer] and type in [place] in the search bar and download the app. The app is free but as with any other app downloads, their phone may require them to verify  saved payment information or Apple/Android password.  - The patient will need to then log into the app with their MyChart username and password, and select [facility] as their healthcare provider to link the account. When it is time for your visit, go to the MyChart app, find appointments, and click Begin Video Visit. Be sure to Select Allow for your device to access the Microphone and Camera for your visit. You will then be connected, and your provider will be with you shortly.  **If they have any issues connecting, or need assistance please contact MyChart service desk [phone])**  **If using a computer, in order to ensure the best quality for their visit they will need to use either of the following Internet Browsers: [employer], or Google Chrome**  IF USING DOXIMITY or DOXY.ME - The patient will receive a link just prior to their visit by text.     FULL LENGTH CONSENT FOR TELE-HEALTH VISIT   I hereby voluntarily request, consent and authorize [facility] and its employed or contracted physicians, physician assistants, nurse practitioners or other licensed health care professionals (the Practitioner), to provide me with telemedicine health care services (the Services") as deemed necessary by the treating Practitioner. I acknowledge and consent to receive the Services by the Practitioner via telemedicine. I understand that the telemedicine visit will involve communicating with the Practitioner through live audiovisual communication technology and the disclosure of certain medical information by electronic transmission. I acknowledge that I have been given the opportunity to request an in-person assessment or other available alternative prior to the telemedicine visit and am voluntarily participating in the telemedicine visit.  I understand that I have the right to withhold or withdraw my consent to the use of telemedicine in the course of my care at any time, without  affecting my right to future care or treatment, and that the Practitioner or I may terminate the telemedicine visit at any time. I understand that I have the right to inspect all information obtained and/or recorded in the course of the telemedicine visit and may receive copies of available information for a reasonable fee.  I understand that some of the potential risks of receiving the Services via telemedicine include:   Delay or interruption in medical evaluation due to technological equipment failure or disruption;  Information transmitted may not be sufficient (e.g. poor resolution of images) to allow for appropriate medical decision making by the Practitioner; and/or   In rare instances, security protocols could fail, causing a breach of personal health information.  Furthermore, I acknowledge that it is my responsibility to provide information about my medical history, conditions and care that is complete and accurate to the best of my ability. I acknowledge that Practitioner's advice, recommendations, and/or decision may be based on factors not within their control, such as incomplete or inaccurate data provided by me or distortions of diagnostic images or specimens that may result from  electronic transmissions. I understand that the practice of medicine is not an exact science and that Practitioner makes no warranties or guarantees regarding treatment outcomes. I acknowledge that I will receive a copy of this consent concurrently upon execution via email to the email address I last provided but may also request a printed copy by calling the office of [facility].    I understand that my insurance will be billed for this visit.   I have read or had this consent read to me.  I understand the contents of this consent, which adequately explains the benefits and risks of the Services being provided via telemedicine.   I have been provided ample opportunity to ask questions regarding this  consent and the Services and have had my questions answered to my satisfaction.  I give my informed consent for the services to be provided through the use of telemedicine in my medical care  By participating in this telemedicine visit I agree to the above.

## 2018-10-30 ENCOUNTER — Telehealth: Payer: Self-pay

## 2018-10-30 NOTE — Telephone Encounter (Signed)
    COVID-19 Pre-Screening Questions:  . In the past 7 to 10 days have you had a cough, shortness of breath, headache, congestion, fever (100 or greater), body aches, chills, sore throat, or sudden loss of taste or sense of smell? . Have you been around anyone with known Covid 19? NO . Have you been around anyone who is awaiting Covid 19 test results in the past 7 to 10 days? NO . Have you been around anyone who has been exposed to Covid 19, or has mentioned symptoms of Covid 19 within the past 7 to 10 days? NO  If you have any concerns/questions about symptoms patients report during screening (either on the phone or at threshold). Contact the provider seeing the patient or DOD for further guidance.  If neither are available contact a member of the leadership team.            

## 2018-11-03 ENCOUNTER — Ambulatory Visit (INDEPENDENT_AMBULATORY_CARE_PROVIDER_SITE_OTHER): Payer: PRIVATE HEALTH INSURANCE | Admitting: Nurse Practitioner

## 2018-11-03 ENCOUNTER — Other Ambulatory Visit: Payer: Self-pay

## 2018-11-03 ENCOUNTER — Encounter: Payer: Self-pay | Admitting: Nurse Practitioner

## 2018-11-03 VITALS — BP 124/68 | HR 67 | Ht 72.0 in | Wt 216.0 lb

## 2018-11-03 DIAGNOSIS — E785 Hyperlipidemia, unspecified: Secondary | ICD-10-CM

## 2018-11-03 DIAGNOSIS — I2510 Atherosclerotic heart disease of native coronary artery without angina pectoris: Secondary | ICD-10-CM | POA: Diagnosis not present

## 2018-11-03 MED ORDER — TICAGRELOR 60 MG PO TABS: 60.0000 mg | ORAL_TABLET | Freq: Two times a day (BID) | ORAL | 3 refills | Status: DC

## 2018-11-03 MED ORDER — METOPROLOL SUCCINATE ER 25 MG PO TB24: 25.0000 mg | ORAL_TABLET | Freq: Every day | ORAL | 3 refills | Status: DC

## 2018-11-03 MED ORDER — ATORVASTATIN CALCIUM 80 MG PO TABS: ORAL_TABLET | ORAL | 3 refills | Status: DC

## 2018-11-03 NOTE — Patient Instructions (Signed)
Medication Instructions:  Your physician recommends that you continue on your current medications as directed. Please refer to the Current Medication list given to you today.  If you need a refill on your cardiac medications before your next appointment, please call your pharmacy.   Lab work: Your physician recommends that you return for lab work in: TODAY (CBC, CMET, LIPID).  If you have labs (blood work) drawn today and your tests are completely normal, you will receive your results only by: Marland Kitchen MyChart Message (if you have MyChart) OR . A paper copy in the mail If you have any lab test that is abnormal or we need to change your treatment, we will call you to review the results.  Testing/Procedures: - None ordered.   Follow-Up: At [facility], you and your health needs are our priority.  As part of our continuing mission to provide you with exceptional heart care, we have created designated Provider Care Teams.  These Care Teams include your primary Cardiologist (physician) and Advanced Practice Providers (APPs -  Physician Assistants and Nurse Practitioners) who all work together to provide you with the care you need, when you need it. You will need a follow up appointment in 6 months as a VIRTUAL VISIT WITH DR ARIDA.  Please call our office 2 months in advance to schedule this appointment.  You may see Kathlyn Sacramento, MD or one of the following Advanced Practice Providers on your designated Care Team:   Murray Hodgkins, NP Christell Faith, PA-C . Marrianne Mood, PA-C  Any Other Special Instructions Will Be Listed Below (If Applicable).  Your provider recommends you to try over the counter Melatonin for sleep.

## 2018-11-03 NOTE — Progress Notes (Signed)
Office Visit    Patient Name: Victor BillWilliam Robert Hilger Jr. Date of Encounter: 11/03/2018  Primary Care Provider:  Patient, No Pcp Per Primary Cardiologist:  Lorine BearsMuhammad Arida, MD  Chief Complaint    43 year old male with a history of CAD status post non-STEMI in July 2018 with stenting LAD, who presents for follow-up of coronary disease.  Past Medical History    Past Medical History:  Diagnosis Date   CAD (coronary artery disease)    a. 11/2016 NSTEMI/PCI: LAD 60p w/ bridge.  FFR 0.81-->PCI performed 2/2 ACS/presumed plaque rupture (2.75x18 Xience Alpine DES). OTW nl cors, EF 55-65%; b. 10/2017 MV: EF 57%, no ischemia.   MVP (mitral valve prolapse)    a. 11/2014 Echo: EF 55-60%, mild LVh, inferolateral HK, mild MVP, mild MR, nl RV fxn.   Obesity    [facility]    a. Dx 09/2016 - s/p 2 wks of doxycycline.   Tick bite    Past Surgical History:  Procedure Laterality Date   CORONARY STENT INTERVENTION N/A 11/19/2016   Procedure: Coronary Stent Intervention;  Surgeon: Yvonne KendallEnd, Castle Lamons, MD;  Location: [facility] INVASIVE CV LAB;  Service: Cardiovascular;  Laterality: N/A;   INTRAVASCULAR PRESSURE WIRE/FFR STUDY N/A 11/19/2016   Procedure: Intravascular Pressure Wire/FFR Study;  Surgeon: Yvonne KendallEnd, Reeya Bound, MD;  Location: [facility] INVASIVE CV LAB;  Service: Cardiovascular;  Laterality: N/A;   LEFT HEART CATH AND CORONARY ANGIOGRAPHY N/A 11/19/2016   Procedure: Left Heart Cath and Coronary Angiography;  Surgeon: Yvonne KendallEnd, Cortni Tays, MD;  Location: [facility] INVASIVE CV LAB;  Service: Cardiovascular;  Laterality: N/A;    Allergies  No Known Allergies  History of Present Illness    43 year old male with the above past medical history including coronary artery disease status post non-STEMI July 2018.  Catheterization at that time showed moderate proximal LAD disease with myocardial bridging and evidence of plaque rupture.  He underwent successful PCI and drug-eluting stent placement.  LV  function was normal.  He has been managed with aspirin, statin, beta-blocker, and Brilinta therapy.  Brilinta dose was reduced to 60 mg twice daily in August 2019.  In June 2019, he noted intermittent fatigue and chest tightness.  Stress testing was undertaken and was low risk.  Since his last visit in August 2019, he has done well.  He has not had any significant chest pain, dyspnea, or change in exercise tolerance.  He has not been walking as frequently as he used to and has gained about 8 pounds.  He started back to walking recently.  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  He denies palpitations, PND, orthopnea, dizziness, syncope, edema, or early satiety.  He has had some issues with sleep.  He falls asleep without any problems but then wakes up in the middle the night and has trouble falling back to sleep.  He is interested in a sleep aid and is willing to take over-the-counter melatonin.  Home Medications    Prior to Admission medications   Medication Sig Start Date End Date Taking? Authorizing Provider  aspirin 81 MG chewable tablet Chew 1 tablet (81 mg total) by mouth daily. 11/20/16  Yes Enid BaasKalisetti, Radhika, MD  atorvastatin (LIPITOR) 80 MG tablet TAKE 1 TABLET BY MOUTH ONCE DAILY AT  6PM 03/05/18  Yes Gollan, Tollie Pizzaimothy J, MD  metoprolol succinate (TOPROL-XL) 25 MG 24 hr tablet TAKE 1 TABLET BY MOUTH ONCE DAILY 03/05/18  Yes Gollan, Tollie Pizzaimothy J, MD  nitroGLYCERIN (NITROSTAT) 0.4 MG SL  tablet Place 1 tablet (0.4 mg total) under the tongue every 5 (five) minutes as needed for chest pain. 12/18/17  Yes Minna Merritts, MD  ticagrelor (BRILINTA) 60 MG TABS tablet Take 1 tablet (60 mg total) by mouth 2 (two) times daily. 12/18/17  Yes Minna Merritts, MD    Review of Systems    Some trouble staying asleep or falling back to sleep after awakening.  He denies chest pain, palpitations, dyspnea, pnd, orthopnea, n, v, dizziness, syncope, edema, weight gain, or early satiety.  All other  systems reviewed and are otherwise negative except as noted above.  Physical Exam    VS:  BP 124/68 (BP Location: Left Arm, Patient Position: Sitting, Cuff Size: Normal)    Pulse 67    Ht 6' (1.829 m)    Wt 216 lb (98 kg)    BMI 29.29 kg/m  , BMI Body mass index is 29.29 kg/m. GEN: Well nourished, well developed, in no acute distress. HEENT: normal. Neck: Supple, no JVD, carotid bruits, or masses. Cardiac: RRR, no murmurs, rubs, or gallops. No clubbing, cyanosis, edema.  Radials/DP/PT 2+ and equal bilaterally.  Respiratory:  Respirations regular and unlabored, clear to auscultation bilaterally. GI: Soft, nontender, nondistended, BS + x 4. MS: no deformity or atrophy. Skin: warm and dry, no rash. Neuro:  Strength and sensation are intact. Psych: Normal affect.  Accessory Clinical Findings    ECG personally reviewed by me today -regular sinus rhythm, 67, left atrial enlargement- no acute changes.  Assessment & Plan    1.  Coronary artery disease: Status post prior non-STEMI in July 2018 with LAD stenting.  Negative Myoview in June 2019.  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  No chest pain or dyspnea.  He remains on aspirin, statin, beta-blocker, and reduced dose of Brilinta therapy.  I will follow-up CBC, complete metabolic panel, and lipids today.  2.  Hyperlipidemia: LDL 38 in June 2019 with normal LFTs.  He remains on high potency statin therapy and is due for follow-up lipids and LFTs.  I will check today.  3.  History of obesity: Weight is up 8 pounds since his last visit though down from 260 pounds at the time of his event in 2018.  He has been walking less and maybe eating a little bit more over the past year.  He is aware of this and just recently started back to walking in the mornings.  4.  Disposition: Follow-up lipids and LFTs.  Follow-up via virtual visit in 6 months or sooner if necessary.  Murray Hodgkins, NP 11/03/2018, 8:19 AM

## 2018-11-04 LAB — CBC WITH DIFFERENTIAL/PLATELET
Basophils Absolute: 0 10*3/uL (ref 0.0–0.2)
Basos: 1 %
EOS (ABSOLUTE): 0.2 10*3/uL (ref 0.0–0.4)
Eos: 3 %
Hematocrit: 46.6 % (ref 37.5–51.0)
Hemoglobin: 14.9 g/dL (ref 13.0–17.7)
Immature Grans (Abs): 0 10*3/uL (ref 0.0–0.1)
Immature Granulocytes: 1 %
Lymphocytes Absolute: 1.4 10*3/uL (ref 0.7–3.1)
Lymphs: 22 %
MCH: 28.9 pg (ref 26.6–33.0)
MCHC: 32 g/dL (ref 31.5–35.7)
MCV: 91 fL (ref 79–97)
Monocytes Absolute: 0.5 10*3/uL (ref 0.1–0.9)
Monocytes: 8 %
Neutrophils Absolute: 4.3 10*3/uL (ref 1.4–7.0)
Neutrophils: 65 %
Platelets: 174 10*3/uL (ref 150–450)
RBC: 5.15 x10E6/uL (ref 4.14–5.80)
RDW: 13 % (ref 11.6–15.4)
WBC: 6.5 10*3/uL (ref 3.4–10.8)

## 2018-11-04 LAB — COMPREHENSIVE METABOLIC PANEL
ALT: 24 IU/L (ref 0–44)
AST: 17 IU/L (ref 0–40)
Albumin/Globulin Ratio: 2 (ref 1.2–2.2)
Albumin: 4.5 g/dL (ref 4.0–5.0)
Alkaline Phosphatase: 40 IU/L (ref 39–117)
BUN/Creatinine Ratio: 13 (ref 9–20)
BUN: 12 mg/dL (ref 6–24)
Bilirubin Total: 0.5 mg/dL (ref 0.0–1.2)
CO2: 24 mmol/L (ref 20–29)
Calcium: 9.7 mg/dL (ref 8.7–10.2)
Chloride: 103 mmol/L (ref 96–106)
Creatinine, Ser: 0.94 mg/dL (ref 0.76–1.27)
GFR calc Af Amer: 114 mL/min/{1.73_m2} (ref >59)
GFR calc non Af Amer: 98 mL/min/{1.73_m2} (ref >59)
Globulin, Total: 2.3 g/dL (ref 1.5–4.5)
Glucose: 88 mg/dL (ref 65–99)
Potassium: 4.6 mmol/L (ref 3.5–5.2)
Sodium: 142 mmol/L (ref 134–144)
Total Protein: 6.8 g/dL (ref 6.0–8.5)

## 2018-11-04 LAB — LIPID PANEL
Chol/HDL Ratio: 1.8 ratio (ref 0.0–5.0)
Cholesterol, Total: 95 mg/dL — ABNORMAL LOW (ref 100–199)
HDL: 52 mg/dL (ref >39)
LDL Calculated: 34 mg/dL (ref 0–99)
Triglycerides: 44 mg/dL (ref 0–149)
VLDL Cholesterol Cal: 9 mg/dL (ref 5–40)

## 2018-11-04 NOTE — Addendum Note (Signed)
Addended by: Alba Destine on: 11/04/2018 09:57 AM   Modules accepted: Orders

## 2019-04-28 ENCOUNTER — Encounter: Payer: Self-pay | Admitting: Cardiovascular Disease

## 2019-04-28 ENCOUNTER — Telehealth (INDEPENDENT_AMBULATORY_CARE_PROVIDER_SITE_OTHER): Payer: PRIVATE HEALTH INSURANCE | Admitting: Cardiovascular Disease

## 2019-04-28 ENCOUNTER — Other Ambulatory Visit: Payer: Self-pay

## 2019-04-28 VITALS — Ht 72.0 in | Wt 215.0 lb

## 2019-04-28 DIAGNOSIS — E785 Hyperlipidemia, unspecified: Secondary | ICD-10-CM

## 2019-04-28 DIAGNOSIS — I2510 Atherosclerotic heart disease of native coronary artery without angina pectoris: Secondary | ICD-10-CM

## 2019-04-28 NOTE — Progress Notes (Signed)
Virtual Visit via Telephone Note   This visit type was conducted due to national recommendations for restrictions regarding the COVID-19 Pandemic (e.g. social distancing) in an effort to limit this patient's exposure and mitigate transmission in our community.  Due to his co-morbid illnesses, this patient is at least at moderate risk for complications without adequate follow up.  This format is felt to be most appropriate for this patient at this time.  The patient did not have access to video technology/had technical difficulties with video requiring transitioning to audio format only (telephone).  All issues noted in this document were discussed and addressed.  No physical exam could be performed with this format.  Please refer to the patient's chart for his  consent [facility].   Date:  04/28/2019   ID:  Victor BillWilliam Robert Paredez Jr., DOB [birth year], MRN [id number]  Patient Location: Home Provider Location: Office  PCP:  Patient, No Pcp Per  Cardiologist:  Lorine BearsMuhammad Mazell Aylesworth, MD  Electrophysiologist:  None   Evaluation Performed:  Follow-Up Visit  Chief Complaint: Doing well with no complaints.  History of Present Illness:    Victor BillWilliam Robert Bettinger Jr. is a 43 y.o. male who was reached via phone visit for follow-up regarding coronary artery disease.  He was hospitalized in July of 2018 with non-ST elevation myocardial infarction.  Cardiac catheterization showed 60% distal LAD stenosis with possible plaque rupture.  This was treated with PCI and drug-eluting stent placement.  No other obstructive disease.  Ejection fraction was normal.  Other medical issues include hyperlipidemia and obesity.  He has been doing well with no recent chest pain, shortness of breath or palpitations.  He takes his medications regularly.   The patient does not have symptoms concerning for COVID-19 infection (fever, chills, cough, or new shortness of breath).    Past Medical History:    Diagnosis Date  . CAD (coronary artery disease)    a. 11/2016 NSTEMI/PCI: LAD 60p w/ bridge.  FFR 0.81-->PCI performed 2/2 ACS/presumed plaque rupture (2.75x18 Xience Alpine DES). OTW nl cors, EF 55-65%; b. 10/2017 MV: EF 57%, no ischemia.  Marland Kitchen. MVP (mitral valve prolapse)    a. 11/2014 Echo: EF 55-60%, mild LVh, inferolateral HK, mild MVP, mild MR, nl RV fxn.  . Obesity   [facility]    a. Dx 09/2016 - s/p 2 wks of doxycycline.  . Tick bite    Past Surgical History:  Procedure Laterality Date  . CORONARY STENT INTERVENTION N/A 11/19/2016   Procedure: Coronary Stent Intervention;  Surgeon: Yvonne KendallEnd, Christopher, MD;  Location: [facility] INVASIVE CV LAB;  Service: Cardiovascular;  Laterality: N/A;  . INTRAVASCULAR PRESSURE WIRE/FFR STUDY N/A 11/19/2016   Procedure: Intravascular Pressure Wire/FFR Study;  Surgeon: Yvonne KendallEnd, Christopher, MD;  Location: [facility] INVASIVE CV LAB;  Service: Cardiovascular;  Laterality: N/A;  . LEFT HEART CATH AND CORONARY ANGIOGRAPHY N/A 11/19/2016   Procedure: Left Heart Cath and Coronary Angiography;  Surgeon: Yvonne KendallEnd, Christopher, MD;  Location: [facility] INVASIVE CV LAB;  Service: Cardiovascular;  Laterality: N/A;     Current Meds  Medication Sig  . aspirin 81 MG chewable tablet Chew 1 tablet (81 mg total) by mouth daily.  Marland Kitchen. atorvastatin (LIPITOR) 80 MG tablet TAKE 1 TABLET BY MOUTH ONCE DAILY AT  6PM  . metoprolol succinate (TOPROL-XL) 25 MG 24 hr tablet Take 1 tablet (25 mg total) by mouth daily.  . nitroGLYCERIN (NITROSTAT) 0.4 MG SL tablet Place 1 tablet (0.4 mg total) under the  tongue every 5 (five) minutes as needed for chest pain.  . ticagrelor (BRILINTA) 60 MG TABS tablet Take 1 tablet (60 mg total) by mouth 2 (two) times daily.     Allergies:   Patient has no known allergies.   Social History   Tobacco Use  . Smoking status: Former Games developer  . Smokeless tobacco: Current User    Types: Chew  . Tobacco comment: 05/09/17 Molly Maduro continues to use dip.  He has cut  back significantly but does not wish to quit.  He sees a Education officer, community regular for oral care.   Substance Use Topics  . Alcohol use: Yes    Comment: 3-4 drinks on the weekends  . Drug use: No     Family Hx: The patient's family history includes CAD in his father; Hypertension in his mother; Melanoma in his father; Other in his mother and sister.  ROS:   Please see the history of present illness.     All other systems reviewed and are negative.   Prior CV studies:   The following studies were reviewed today:    Labs/Other Tests and Data Reviewed:    EKG:  No ECG reviewed.  Recent Labs: 11/03/2018: ALT 24; BUN 12; Creatinine, Ser 0.94; Hemoglobin 14.9; Platelets 174; Potassium 4.6; Sodium 142   Recent Lipid Panel Lab Results  Component Value Date/Time   CHOL 95 (L) 11/03/2018 08:34 AM   TRIG 44 11/03/2018 08:34 AM   HDL 52 11/03/2018 08:34 AM   CHOLHDL 1.8 11/03/2018 08:34 AM   CHOLHDL 2.0 10/08/2017 07:11 AM   LDLCALC 34 11/03/2018 08:34 AM    Wt Readings from Last 3 Encounters:  04/28/19 215 lb (97.5 kg)  11/03/18 216 lb (98 kg)  12/18/17 202 lb 12 oz (92 kg)     Objective:    Vital Signs:  Ht 6' (1.829 m)   Wt 215 lb (97.5 kg)   BMI 29.16 kg/m    VITAL SIGNS:  reviewed  ASSESSMENT & PLAN:    1.  Coronary artery disease involving native coronary arteries without angina: He is doing extremely well with no recurrent anginal symptoms.  It has been more than 2 years since his myocardial infarction and stent placement.  I discussed with him that dual antiplatelet therapy at this point is optional but he is more comfortable staying on ticagrelor and aspirin at the present time.  I think he is overall at low risk for ischemic complications given that his coronary artery disease was localized and borderline.  He was under the impression that he needed to stay on dual antiplatelet therapy indefinitely.  Will discuss with him again in 6 months and discontinue ticagrelor at that  time.  Continue aspirin indefinitely.   2.  Hyperlipidemia: Continue high-dose atorvastatin.  Most recent lipid profile showed an LDL of 34.  We could consider de-escalating the dose of atorvastatin to 40 mg in the near future.   COVID-19 Education: The signs and symptoms of COVID-19 were discussed with the patient and how to seek care for testing (follow up with PCP or arrange E-visit).  The importance of social distancing was discussed today.  Time:   Today, I have spent 8 minutes with the patient with telehealth technology discussing the above problems.     Medication Adjustments/Labs and Tests Ordered: Current medicines are reviewed at length with the patient today.  Concerns regarding medicines are outlined above.   Tests Ordered: No orders of the defined types were placed in this  encounter.   Medication Changes: No orders of the defined types were placed in this encounter.   Follow Up:  In Person in 6 month(s)  Signed, Kathlyn Sacramento, MD  04/28/2019 2:17 PM    [facility]

## 2019-04-28 NOTE — Patient Instructions (Signed)
Medication Instructions:  Continue same medications . *If you need a refill on your cardiac medications before your next appointment, please call your pharmacy*  Lab Work: None If you have labs (blood work) drawn today and your tests are completely normal, you will receive your results only by: . MyChart Message (if you have MyChart) OR . A paper copy in the mail If you have any lab test that is abnormal or we need to change your treatment, we will call you to review the results.  Testing/Procedures: None  Follow-Up: At [facility], you and your health needs are our priority.  As part of our continuing mission to provide you with exceptional heart care, we have created designated Provider Care Teams.  These Care Teams include your primary Cardiologist (physician) and Advanced Practice Providers (APPs -  Physician Assistants and Nurse Practitioners) who all work together to provide you with the care you need, when you need it.  Your next appointment:   6 month(s)  The format for your next appointment:   In Person  Provider:    You may see Claire Dolores, MD or one of the following Advanced Practice Providers on your designated Care Team:    Christopher Berge, NP  Ryan Dunn, PA-C  Jacquelyn Visser, PA-C    

## 2019-11-14 NOTE — Progress Notes (Signed)
Office Visit    Patient Name: Victor Ibarra. Date of Encounter: 11/15/2019  Primary Care Provider:  Patient, No Pcp Per Primary Cardiologist:  Lorine Bears, MD Electrophysiologist:  None   Chief Complaint    Victor Ibarra. is a 44 y.o. male with a hx of CAD (10/2016 NSTEMI with PCI/DES to LAD), HLD, obesity presents today for follow up of CAD   Past Medical History    Past Medical History:  Diagnosis Date  . CAD (coronary artery disease)    a. 11/2016 NSTEMI/PCI: LAD 60p w/ bridge.  FFR 0.81-->PCI performed 2/2 ACS/presumed plaque rupture (2.75x18 Xience Alpine DES). OTW nl cors, EF 55-65%; b. 10/2017 MV: EF 57%, no ischemia.  Marland Kitchen MVP (mitral valve prolapse)    a. 11/2014 Echo: EF 55-60%, mild LVh, inferolateral HK, mild MVP, mild MR, nl RV fxn.  . Obesity   . [facility] spotted fever    a. Dx 09/2016 - s/p 2 wks of doxycycline.  . Tick bite    Past Surgical History:  Procedure Laterality Date  . CORONARY STENT INTERVENTION N/A 11/19/2016   Procedure: Coronary Stent Intervention;  Surgeon: Yvonne Kendall, MD;  Location: [facility] INVASIVE CV LAB;  Service: Cardiovascular;  Laterality: N/A;  . INTRAVASCULAR PRESSURE WIRE/FFR STUDY N/A 11/19/2016   Procedure: Intravascular Pressure Wire/FFR Study;  Surgeon: Yvonne Kendall, MD;  Location: [facility] INVASIVE CV LAB;  Service: Cardiovascular;  Laterality: N/A;  . LEFT HEART CATH AND CORONARY ANGIOGRAPHY N/A 11/19/2016   Procedure: Left Heart Cath and Coronary Angiography;  Surgeon: Yvonne Kendall, MD;  Location: [facility] INVASIVE CV LAB;  Service: Cardiovascular;  Laterality: N/A;   Allergies  No Known Allergies  History of Present Illness    Victor Ibarra. is a 44 y.o. male with a hx of CAD (10/2016 NSTEMI with PCI/DES to LAD), HLD, obesity. He was last seen via telemedicine 12/23 20 by Dr. Kirke Corin.  Presented with NSTEMI in July 2018 and was treated with PCI/DES to LAD. He had a negative Lexiscan  Myoview in June 2019.   He reports feeling overall well since last seen.  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  Does endorse he has gained a little bit of weight and plans to monitor his diet more closely over the next couple of months.    He does endorse some difficulty with sleeping.  He tells me he goes to bed about 9:30 at night and falls asleep easily but then wakes up around 3:30 AM prior to his 5 AM alarm.  Tells me this has been ongoing for a number of years since his heart attack in feels that his brain is "working overtime". We discussed the importance of a nighttime routine and possible utilization of melatonin.  He denies orthopnea.  Reports no shortness of breath nor dyspnea on exertion. Reports no chest pain, pressure, or tightness. No edema, orthopnea, PND. Reports no palpitations, lightheadedness, dizziness, near syncope.  EKGs/Labs/Other Studies Reviewed:   The following studies were reviewed today:   EKG:  EKG is ordered today.  The ekg ordered today demonstrates NSR 64 bpm with no acute ST/T wave changes.  Recent Labs: No results found for requested labs within last 8760 hours.  Recent Lipid Panel    Component Value Date/Time   CHOL 95 (L) 11/03/2018 0834   TRIG 44 11/03/2018 0834   HDL 52 11/03/2018 0834   CHOLHDL 1.8 11/03/2018 0834   CHOLHDL 2.0 10/08/2017 6073  VLDL 5 10/08/2017 0711   LDLCALC 34 11/03/2018 0834    Home Medications   Current Meds  Medication Sig  . aspirin 81 MG chewable tablet Chew 1 tablet (81 mg total) by mouth daily.  Marland Kitchen atorvastatin (LIPITOR) 80 MG tablet TAKE 1 TABLET BY MOUTH ONCE DAILY AT  6PM  . metoprolol succinate (TOPROL-XL) 25 MG 24 hr tablet Take 1 tablet (25 mg total) by mouth daily.  . nitroGLYCERIN (NITROSTAT) 0.4 MG SL tablet Place 1 tablet (0.4 mg total) under the tongue every 5 (five) minutes as needed for chest pain.  . ticagrelor (BRILINTA) 60 MG TABS tablet Take 1 tablet (60 mg total) by mouth 2 (two)  times daily.      Review of Systems   Review of Systems  Constitutional: Negative for chills, fever and malaise/fatigue.  Cardiovascular: Negative for chest pain, dyspnea on exertion, irregular heartbeat, leg swelling, near-syncope, orthopnea, palpitations and syncope.  Respiratory: Negative for cough, shortness of breath and wheezing.   Gastrointestinal: Negative for melena, nausea and vomiting.  Genitourinary: Negative for hematuria.  Neurological: Negative for dizziness, light-headedness and weakness.   All other systems reviewed and are otherwise negative except as noted above.  Physical Exam    VS:  BP 120/72 (BP Location: Left Arm, Patient Position: Sitting, Cuff Size: Normal)   Pulse 64   Ht 6' (1.829 m)   Wt 227 lb 8 oz (103.2 kg)   SpO2 98%   BMI 30.85 kg/m  , BMI Body mass index is 30.85 kg/m. GEN: Well nourished, well developed, in no acute distress. HEENT: normal. Neck: Supple, no JVD, carotid bruits, or masses. Cardiac: RRR, no murmurs, rubs, or gallops. No clubbing, cyanosis, edema.  Radials/DP/PT 2+ and equal bilaterally.  Respiratory:  Respirations regular and unlabored, clear to auscultation bilaterally. GI: Soft, nontender, nondistended, BS + x 4. MS: No deformity or atrophy. Skin: Warm and dry, no rash. Neuro:  Strength and sensation are intact. Psych: Normal affect.  Assessment & Plan    1. CAD -stable with no anginal symptoms.  No indication for ischemic evaluation this time.  GDMT includes aspirin, Brilinta, Toprol, atorvastatin.  Refill of PRN nitroglycerin provided as his is out of date.  Continue regular cardiovascular exercise.  Continue low-sodium, heart healthy diet.  CMP, CBC, lipid panel today for monitoring.  2. HLD, LDL goal <70 -lipid panel today for monitoring.  Continue atorvastatin 80 mg daily.  Denies myalgias.  Disposition: Follow up in 1 year(s) with Dr. Kirke Corin or APP   Alver Sorrow, NP 11/15/2019, 8:18 AM

## 2019-11-15 ENCOUNTER — Ambulatory Visit (INDEPENDENT_AMBULATORY_CARE_PROVIDER_SITE_OTHER): Payer: 59 | Admitting: Family

## 2019-11-15 ENCOUNTER — Encounter: Payer: Self-pay | Admitting: Family

## 2019-11-15 ENCOUNTER — Other Ambulatory Visit: Payer: Self-pay

## 2019-11-15 VITALS — BP 120/72 | HR 64 | Ht 72.0 in | Wt 227.5 lb

## 2019-11-15 DIAGNOSIS — I25118 Atherosclerotic heart disease of native coronary artery with other forms of angina pectoris: Secondary | ICD-10-CM

## 2019-11-15 DIAGNOSIS — E785 Hyperlipidemia, unspecified: Secondary | ICD-10-CM

## 2019-11-15 MED ORDER — NITROGLYCERIN 0.4 MG SL SUBL: 0.4000 mg | SUBLINGUAL_TABLET | SUBLINGUAL | 2 refills | Status: DC | PRN

## 2019-11-15 NOTE — Patient Instructions (Addendum)
Medication Instructions:  No medication changes today.   We sent a refill of your nitroglycerin to your pharmacy. We will send the remainder of your medications in once we have your lab results tomorrow.   *If you need a refill on your cardiac medications before your next appointment, please call your pharmacy*  Lab Work: Your physician recommends lab work today: CMET, lipid panel, CBC  If you have labs (blood work) drawn today and your tests are completely normal, you will receive your results only by: Marland Kitchen MyChart Message (if you have MyChart) OR . A paper copy in the mail If you have any lab test that is abnormal or we need to change your treatment, we will call you to review the results.   Testing/Procedures: Your EKG today showed normal sinus rhythm.   Follow-Up: At [facility], you and your health needs are our priority.  As part of our continuing mission to provide you with exceptional heart care, we have created designated Provider Care Teams.  These Care Teams include your primary Cardiologist (physician) and Advanced Practice Providers (APPs -  Physician Assistants and Nurse Practitioners) who all work together to provide you with the care you need, when you need it.  We recommend signing up for the patient portal called "MyChart".  Sign up information is provided on this After Visit Summary.  MyChart is used to connect with patients for Virtual Visits (Telemedicine).  Patients are able to view lab/test results, encounter notes, upcoming appointments, etc.  Non-urgent messages can be sent to your provider as well.   To learn more about what you can do with MyChart, go to [email or web address].    Your next appointment:   1 year(s)  The format for your next appointment:   In Person  Provider:   You may see Lorine Bears, MD or one of the following Advanced Practice Providers on your designated Care Team:    Nicolasa Ducking, NP  Eula Listen, PA-C  Marisue Ivan,  PA-C  Gillian Shields, NP  Other Instructions  DASH Eating Plan DASH stands for "Dietary Approaches to Stop Hypertension." The DASH eating plan is a healthy eating plan that has been shown to reduce high blood pressure (hypertension). It may also reduce your risk for type 2 diabetes, heart disease, and stroke. The DASH eating plan may also help with weight loss. What are tips for following this plan?  General guidelines  Avoid eating more than 2,300 mg (milligrams) of salt (sodium) a day. If you have hypertension, you may need to reduce your sodium intake to 1,500 mg a day.  Limit alcohol intake to no more than 1 drink a day for nonpregnant women and 2 drinks a day for men. One drink equals 12 oz of beer, 5 oz of wine, or 1 oz of hard liquor.  Work with your health care provider to maintain a healthy body weight or to lose weight. Ask what an ideal weight is for you.  Get at least 30 minutes of exercise that causes your heart to beat faster (aerobic exercise) most days of the week. Activities may include walking, swimming, or biking.  Work with your health care provider or diet and nutrition specialist (dietitian) to adjust your eating plan to your individual calorie needs. Reading food labels   Check food labels for the amount of sodium per serving. Choose foods with less than 5 percent of the Daily Value of sodium. Generally, foods with less than 300 mg of sodium per serving fit  into this eating plan.  To find whole grains, look for the word "whole" as the first word in the ingredient list. Shopping  Buy products labeled as "low-sodium" or "no salt added."  Buy fresh foods. Avoid canned foods and premade or frozen meals. Cooking  Avoid adding salt when cooking. Use salt-free seasonings or herbs instead of table salt or sea salt. Check with your health care provider or pharmacist before using salt substitutes.  Do not fry foods. Cook foods using healthy methods such as baking,  boiling, grilling, and broiling instead.  Cook with heart-healthy oils, such as olive, canola, soybean, or sunflower oil. Meal planning  Eat a balanced diet that includes: ? 5 or more servings of fruits and vegetables each day. At each meal, try to fill half of your plate with fruits and vegetables. ? Up to 6-8 servings of whole grains each day. ? Less than 6 oz of lean meat, poultry, or fish each day. A 3-oz serving of meat is about the same size as a deck of cards. One egg equals 1 oz. ? 2 servings of low-fat dairy each day. ? A serving of nuts, seeds, or beans 5 times each week. ? Heart-healthy fats. Healthy fats called Omega-3 fatty acids are found in foods such as flaxseeds and coldwater fish, like sardines, salmon, and mackerel.  Limit how much you eat of the following: ? Canned or prepackaged foods. ? Food that is high in trans fat, such as fried foods. ? Food that is high in saturated fat, such as fatty meat. ? Sweets, desserts, sugary drinks, and other foods with added sugar. ? Full-fat dairy products.  Do not salt foods before eating.  Try to eat at least 2 vegetarian meals each week.  Eat more home-cooked food and less restaurant, buffet, and fast food.  When eating at a restaurant, ask that your food be prepared with less salt or no salt, if possible. What foods are recommended? The items listed may not be a complete list. Talk with your dietitian about what dietary choices are best for you. Grains Whole-grain or whole-wheat bread. Whole-grain or whole-wheat pasta. Brown rice. Orpah Cobb. Bulgur. Whole-grain and low-sodium cereals. Pita bread. Low-fat, low-sodium crackers. Whole-wheat flour tortillas. Vegetables Fresh or frozen vegetables (raw, steamed, roasted, or grilled). Low-sodium or reduced-sodium tomato and vegetable juice. Low-sodium or reduced-sodium tomato sauce and tomato paste. Low-sodium or reduced-sodium canned vegetables. Fruits All fresh, dried, or  frozen fruit. Canned fruit in natural juice (without added sugar). Meat and other protein foods Skinless chicken or [place]. Ground chicken or [place]. Pork with fat trimmed off. Fish and seafood. Egg whites. Dried beans, peas, or lentils. Unsalted nuts, nut butters, and seeds. Unsalted canned beans. Lean cuts of beef with fat trimmed off. Low-sodium, lean deli meat. Dairy Low-fat (1%) or fat-free (skim) milk. Fat-free, low-fat, or reduced-fat cheeses. Nonfat, low-sodium ricotta or cottage cheese. Low-fat or nonfat yogurt. Low-fat, low-sodium cheese. Fats and oils Soft margarine without trans fats. Vegetable oil. Low-fat, reduced-fat, or light mayonnaise and salad dressings (reduced-sodium). Canola, safflower, olive, soybean, and sunflower oils. Avocado. Seasoning and other foods Herbs. Spices. Seasoning mixes without salt. Unsalted popcorn and pretzels. Fat-free sweets. What foods are not recommended? The items listed may not be a complete list. Talk with your dietitian about what dietary choices are best for you. Grains Baked goods made with fat, such as croissants, muffins, or some breads. Dry pasta or rice meal packs. Vegetables Creamed or fried vegetables. Vegetables in a cheese  sauce. Regular canned vegetables (not low-sodium or reduced-sodium). Regular canned tomato sauce and paste (not low-sodium or reduced-sodium). Regular tomato and vegetable juice (not low-sodium or reduced-sodium). Rosita Fire. Olives. Fruits Canned fruit in a light or heavy syrup. Fried fruit. Fruit in cream or butter sauce. Meat and other protein foods Fatty cuts of meat. Ribs. Fried meat. Tomasa Blase. Sausage. Bologna and other processed lunch meats. Salami. Fatback. Hotdogs. Bratwurst. Salted nuts and seeds. Canned beans with added salt. Canned or smoked fish. Whole eggs or egg yolks. Chicken or [place] with skin. Dairy Whole or 2% milk, cream, and half-and-half. Whole or full-fat cream cheese. Whole-fat or sweetened yogurt.  Full-fat cheese. Nondairy creamers. Whipped toppings. Processed cheese and cheese spreads. Fats and oils Butter. Stick margarine. Lard. Shortening. Ghee. Bacon fat. Tropical oils, such as coconut, palm kernel, or palm oil. Seasoning and other foods Salted popcorn and pretzels. Onion salt, garlic salt, seasoned salt, table salt, and sea salt. Worcestershire sauce. Tartar sauce. Barbecue sauce. Teriyaki sauce. Soy sauce, including reduced-sodium. Steak sauce. Canned and packaged gravies. Fish sauce. Oyster sauce. Cocktail sauce. Horseradish that you find on the shelf. Ketchup. Mustard. Meat flavorings and tenderizers. Bouillon cubes. Hot sauce and Tabasco sauce. Premade or packaged marinades. Premade or packaged taco seasonings. Relishes. Regular salad dressings. Where to find more information:  National Heart, Lung, and Blood Institute: PopSteam.is  American Heart Association: www.heart.org Summary  The DASH eating plan is a healthy eating plan that has been shown to reduce high blood pressure (hypertension). It may also reduce your risk for type 2 diabetes, heart disease, and stroke.  With the DASH eating plan, you should limit salt (sodium) intake to 2,300 mg a day. If you have hypertension, you may need to reduce your sodium intake to 1,500 mg a day.  When on the DASH eating plan, aim to eat more fresh fruits and vegetables, whole grains, lean proteins, low-fat dairy, and heart-healthy fats.  Work with your health care provider or diet and nutrition specialist (dietitian) to adjust your eating plan to your individual calorie needs. This information is not intended to replace advice given to you by your health care provider. Make sure you discuss any questions you have with your health care provider. Document Revised: 04/04/2017 Document Reviewed: 04/15/2016 Elsevier Patient Education  2020 [employer].

## 2019-11-16 ENCOUNTER — Telehealth: Payer: Self-pay

## 2019-11-16 DIAGNOSIS — E785 Hyperlipidemia, unspecified: Secondary | ICD-10-CM

## 2019-11-16 LAB — CBC
Hematocrit: 46.4 % (ref 37.5–51.0)
Hemoglobin: 15.2 g/dL (ref 13.0–17.7)
MCH: 29.4 pg (ref 26.6–33.0)
MCHC: 32.8 g/dL (ref 31.5–35.7)
MCV: 90 fL (ref 79–97)
Platelets: 182 10*3/uL (ref 150–450)
RBC: 5.17 x10E6/uL (ref 4.14–5.80)
RDW: 12.3 % (ref 11.6–15.4)
WBC: 7.4 10*3/uL (ref 3.4–10.8)

## 2019-11-16 LAB — COMPREHENSIVE METABOLIC PANEL
ALT: 27 IU/L (ref 0–44)
AST: 21 IU/L (ref 0–40)
Albumin/Globulin Ratio: 1.8 (ref 1.2–2.2)
Albumin: 4.3 g/dL (ref 4.0–5.0)
Alkaline Phosphatase: 38 IU/L — ABNORMAL LOW (ref 48–121)
BUN/Creatinine Ratio: 11 (ref 9–20)
BUN: 12 mg/dL (ref 6–24)
Bilirubin Total: 0.5 mg/dL (ref 0.0–1.2)
CO2: 27 mmol/L (ref 20–29)
Calcium: 9.4 mg/dL (ref 8.7–10.2)
Chloride: 104 mmol/L (ref 96–106)
Creatinine, Ser: 1.06 mg/dL (ref 0.76–1.27)
GFR calc Af Amer: 97 mL/min/{1.73_m2} (ref >59)
GFR calc non Af Amer: 84 mL/min/{1.73_m2} (ref >59)
Globulin, Total: 2.4 g/dL (ref 1.5–4.5)
Glucose: 97 mg/dL (ref 65–99)
Potassium: 4.8 mmol/L (ref 3.5–5.2)
Sodium: 142 mmol/L (ref 134–144)
Total Protein: 6.7 g/dL (ref 6.0–8.5)

## 2019-11-16 LAB — LIPID PANEL
Chol/HDL Ratio: 1.9 ratio (ref 0.0–5.0)
Cholesterol, Total: 90 mg/dL — ABNORMAL LOW (ref 100–199)
HDL: 47 mg/dL (ref >39)
LDL Chol Calc (NIH): 32 mg/dL (ref 0–99)
Triglycerides: 39 mg/dL (ref 0–149)
VLDL Cholesterol Cal: 11 mg/dL (ref 5–40)

## 2019-11-16 MED ORDER — METOPROLOL SUCCINATE ER 25 MG PO TB24: 25.0000 mg | ORAL_TABLET | Freq: Every day | ORAL | 3 refills | Status: DC

## 2019-11-16 MED ORDER — TICAGRELOR 60 MG PO TABS: 60.0000 mg | ORAL_TABLET | Freq: Two times a day (BID) | ORAL | 3 refills | Status: DC

## 2019-11-16 MED ORDER — ATORVASTATIN CALCIUM 40 MG PO TABS: ORAL_TABLET | ORAL | 3 refills | Status: DC

## 2019-11-16 NOTE — Telephone Encounter (Signed)
-----   Message from Alver Sorrow, NP sent at 11/16/2019  7:39 AM EDT ----- Normal kidney function & electrolytes. Liver function is good. Cholesterol panel looks great! LDL at goal of <70. Because cholesterol numbers have been so good, will reduce Atorvastatin to 40mg  daily. Plan for repeat lipid/liver panel in 3 months.  (Of note, please refill Brilinta and Toprol at same doses for 1 year - wanted to ensure labs were stable before sending)

## 2019-11-16 NOTE — Telephone Encounter (Signed)
Call to patient to review labs.  °  °Pt verbalized understanding and has no further questions at this time.  °  °Advised pt to call for any further questions or concerns.  °Orders updated as advised.  °

## 2020-07-10 ENCOUNTER — Ambulatory Visit
Admission: RE | Admit: 2020-07-10 | Discharge: 2020-07-10 | Disposition: A | Discharge status: Home / Self Care | Payer: 59 | Source: Ambulatory Visit | Attending: Family Medicine | Admitting: Family Medicine

## 2020-07-10 ENCOUNTER — Other Ambulatory Visit: Payer: Self-pay

## 2020-07-10 VITALS — BP 130/85 | HR 82 | Temp 98.8°F | Resp 18 | Wt 220.0 lb

## 2020-07-10 DIAGNOSIS — B029 Zoster without complications: Secondary | ICD-10-CM | POA: Diagnosis not present

## 2020-07-10 MED ORDER — VALACYCLOVIR HCL 1 G PO TABS: 1000.0000 mg | ORAL_TABLET | Freq: Three times a day (TID) | ORAL | 0 refills | Status: AC

## 2020-07-10 NOTE — ED Triage Notes (Signed)
Patient stated since Thursday noticed rash on side of abd with pain.    Denies: Fever,N/V and Diarrhea  OTC: Tylenol

## 2020-07-10 NOTE — Discharge Instructions (Addendum)
Treating you for shingles Take the medication as prescribed Tylenol for pain Follow up as needed for continued or worsening symptoms

## 2020-07-10 NOTE — ED Provider Notes (Signed)
Victor Ibarra    CSN: [id number] Arrival date & time: 07/10/20  1341      History   Chief Complaint Chief Complaint  Patient presents with  . Rash    HPI Victor Ibarra. is a 45 y.o. male.   Patient is a 45 year old male who presents today with right flank pain, rash.  The pain started approximate 1 week ago and noticed rash a few days ago.  The rash is painful.  No itching or irritation.  No fever, chills, body aches.  Taken Tylenol for symptoms with some relief of pain.     Past Medical History:  Diagnosis Date  . CAD (coronary artery disease)    a. 11/2016 NSTEMI/PCI: LAD 60p w/ bridge.  FFR 0.81-->PCI performed 2/2 ACS/presumed plaque rupture (2.75x18 Xience Alpine DES). OTW nl cors, EF 55-65%; b. 10/2017 MV: EF 57%, no ischemia.  Marland Kitchen MVP (mitral valve prolapse)    a. 11/2014 Echo: EF 55-60%, mild LVh, inferolateral HK, mild MVP, mild MR, nl RV fxn.  . Obesity   . [facility] spotted fever    a. Dx 09/2016 - s/p 2 wks of doxycycline.  . Tick bite     Patient Active Problem List   Diagnosis Date Noted  . Hyperlipidemia 12/18/2017  . 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 NSTEMI (non-ST elevated myocardial infarction) (HCC) 11/18/2016    Past Surgical History:  Procedure Laterality Date  . CORONARY STENT INTERVENTION N/A 11/19/2016   Procedure: Coronary Stent Intervention;  Surgeon: Yvonne Kendall, MD;  Location: [facility] INVASIVE CV LAB;  Service: Cardiovascular;  Laterality: N/A;  . INTRAVASCULAR PRESSURE WIRE/FFR STUDY N/A 11/19/2016   Procedure: Intravascular Pressure Wire/FFR Study;  Surgeon: Yvonne Kendall, MD;  Location: [facility] INVASIVE CV LAB;  Service: Cardiovascular;  Laterality: N/A;  . LEFT HEART CATH AND CORONARY ANGIOGRAPHY N/A 11/19/2016   Procedure: Left Heart Cath and Coronary Angiography;  Surgeon: Yvonne Kendall, MD;  Location: [facility] INVASIVE CV LAB;  Service: Cardiovascular;   Laterality: N/A;       Home Medications    Prior to Admission medications   Medication Sig Start Date End Date Taking? Authorizing Provider  valACYclovir (VALTREX) 1000 MG tablet Take 1 tablet (1,000 mg total) by mouth 3 (three) times daily. 07/10/20  Yes Azelie Noguera A, NP  aspirin 81 MG chewable tablet Chew 1 tablet (81 mg total) by mouth daily. 11/20/16   Enid Baas, MD  atorvastatin (LIPITOR) 40 MG tablet TAKE 1 TABLET BY MOUTH ONCE DAILY AT  [facility] 11/16/19   Alver Sorrow, NP  metoprolol succinate (TOPROL-XL) 25 MG 24 hr tablet Take 1 tablet (25 mg total) by mouth daily. 11/16/19   Alver Sorrow, NP  nitroGLYCERIN (NITROSTAT) 0.4 MG SL tablet Place 1 tablet (0.4 mg total) under the tongue every 5 (five) minutes as needed for chest pain. 11/15/19   Alver Sorrow, NP  ticagrelor (BRILINTA) 60 MG TABS tablet Take 1 tablet (60 mg total) by mouth 2 (two) times daily. 11/16/19   Alver Sorrow, NP    Family History Family History  Problem Relation Age of Onset  . Other Mother        alive and well.  . Hypertension Mother   . CAD Father        s/p stenting in his 49's. Told he also had a CTO with prob prior silent MI.  . Melanoma Father   . Other Sister  alive and well.    Social History Social History   Tobacco Use  . Smoking status: Former Games developer  . Smokeless tobacco: Current User    Types: Chew  . Tobacco comment: 05/09/17 Molly Maduro continues to use dip.  He has cut back significantly but does not wish to quit.  He sees a Education officer, community regular for oral care.   Vaping Use  . Vaping Use: Never used  Substance Use Topics  . Alcohol use: Yes    Comment: 3-4 drinks on the weekends  . Drug use: No     Allergies   Patient has no known allergies.   Review of Systems Review of Systems   Physical Exam Triage Vital Signs ED Triage Vitals  Enc Vitals Group     BP 07/10/20 1408 130/85     Pulse Rate 07/10/20 1408 82     Resp 07/10/20 1408 18     Temp  07/10/20 1408 98.8 F (37.1 C)     Temp Source 07/10/20 1408 Oral     SpO2 07/10/20 1408 96 %     Weight 07/10/20 1357 220 lb (99.8 kg)     Height --      Head Circumference --      Peak Flow --      Pain Score 07/10/20 1356 7     Pain Loc --      Pain Edu? --      Excl. in GC? --    No data found.  Updated Vital Signs BP 130/85 (BP Location: Left Arm)   Pulse 82   Temp 98.8 F (37.1 C) (Oral)   Resp 18   Wt 220 lb (99.8 kg)   SpO2 96%   BMI 29.84 kg/m   Visual Acuity Right Eye Distance:   Left Eye Distance:   Bilateral Distance:    Right Eye Near:   Left Eye Near:    Bilateral Near:     Physical Exam Vitals and nursing note reviewed.  Constitutional:      Appearance: Normal appearance.  HENT:     Head: Normocephalic and atraumatic.     Nose: Nose normal.  Eyes:     Conjunctiva/sclera: Conjunctivae normal.  Pulmonary:     Effort: Pulmonary effort is normal.  Musculoskeletal:        General: Normal range of motion.     Cervical back: Normal range of motion.  Skin:    General: Skin is warm and dry.     Findings: Rash present.  Neurological:     Mental Status: He is alert.  Psychiatric:        Mood and Affect: Mood normal.      UC Treatments / Results  Labs (all labs ordered are listed, but only abnormal results are displayed) Labs Reviewed - No data to display  EKG   Radiology No results found.  Procedures Procedures (including critical care time)  Medications Ordered in UC Medications - No data to display  Initial Impression / Assessment and Plan / UC Course  I have reviewed the triage vital signs and the nursing notes.  Pertinent labs & imaging results that were available during my care of the patient were reviewed by me and considered in my medical decision making (see chart for details).     Herpes Zoster Rash consistent with this.  Treating with valtrex Continue the tylenol as needed.  Follow up as needed for continued or  worsening symptoms  Final Clinical Impressions(s) / UC Diagnoses  Final diagnoses:  Herpes zoster without complication     Discharge Instructions     Treating you for shingles Take the medication as prescribed Tylenol for pain Follow up as needed for continued or worsening symptoms     ED Prescriptions    Medication Sig Dispense Auth. Provider   valACYclovir (VALTREX) 1000 MG tablet Take 1 tablet (1,000 mg total) by mouth 3 (three) times daily. 21 tablet Skipper Dacosta A, NP     PDMP not reviewed this encounter.   Dahlia Byes A, NP 07/10/20 1457

## 2020-07-26 ENCOUNTER — Telehealth: Payer: Self-pay | Admitting: Family

## 2020-07-26 NOTE — Telephone Encounter (Signed)
Please call to discuss if patient's lab order is still up to date.

## 2020-07-27 NOTE — Telephone Encounter (Signed)
Was able to return pt's call, was inquiring about his lipid panel, if the order was still present. Advised he had till July 2022 to have labs drawn. Pt reports will have labs drawn this week, advised on fasting and hours to walk into medical mall. Pt verbalized understanding, grateful for return phone call. Nothing further.

## 2020-08-10 ENCOUNTER — Telehealth: Payer: Self-pay | Admitting: *Deleted

## 2020-08-10 ENCOUNTER — Other Ambulatory Visit
Admission: RE | Admit: 2020-08-10 | Discharge: 2020-08-10 | Disposition: A | Discharge status: Home / Self Care | Payer: 59 | Source: Ambulatory Visit | Attending: Family | Admitting: Family

## 2020-08-10 DIAGNOSIS — E785 Hyperlipidemia, unspecified: Secondary | ICD-10-CM | POA: Insufficient documentation

## 2020-08-10 LAB — HEPATIC FUNCTION PANEL
ALT: 29 U/L (ref 0–44)
AST: 20 U/L (ref 15–41)
Albumin: 4.1 g/dL (ref 3.5–5.0)
Alkaline Phosphatase: 34 U/L — ABNORMAL LOW (ref 38–126)
Bilirubin, Direct: <0.1 mg/dL (ref 0.0–0.2)
Total Bilirubin: 0.6 mg/dL (ref 0.3–1.2)
Total Protein: 7.3 g/dL (ref 6.5–8.1)

## 2020-08-10 LAB — LIPID PANEL
Cholesterol: 103 mg/dL (ref 0–200)
HDL: 51 mg/dL (ref >40)
LDL Cholesterol: 45 mg/dL (ref 0–99)
Total CHOL/HDL Ratio: 2 RATIO
Triglycerides: 34 mg/dL (ref <150)
VLDL: 7 mg/dL (ref 0–40)

## 2020-08-10 NOTE — Telephone Encounter (Signed)
Spoke to pt, notified of lab results and provider's recc.  Pt verbalized understanding. He will continue Atorvastatin 40mg  daily.  Pt states he scheduled his follow up appointment online and would like to discuss any other available times to possible reschedule.  Forwarding to scheduling to call and assist pt.

## 2020-08-10 NOTE — Telephone Encounter (Signed)
-----   Message from Alver Sorrow, NP sent at 08/10/2020  7:55 AM EDT ----- Lipid panel shows all of his cholesterol numbers remain at goal. LDL <70 which is a great result. Liver enzymes were normal. Alkaline phosphatase mildly low which is stable finding and not of concern - we would be concerned if it were elevated. Continue Atorvastatin 40mg  daily.

## 2020-11-08 ENCOUNTER — Other Ambulatory Visit: Payer: Self-pay | Admitting: Family

## 2020-11-21 ENCOUNTER — Ambulatory Visit (INDEPENDENT_AMBULATORY_CARE_PROVIDER_SITE_OTHER): Payer: 59 | Admitting: Cardiovascular Disease

## 2020-11-21 ENCOUNTER — Other Ambulatory Visit: Payer: Self-pay

## 2020-11-21 ENCOUNTER — Encounter: Payer: Self-pay | Admitting: Cardiovascular Disease

## 2020-11-21 VITALS — BP 128/90 | HR 67 | Ht 72.0 in | Wt 227.4 lb

## 2020-11-21 DIAGNOSIS — I2510 Atherosclerotic heart disease of native coronary artery without angina pectoris: Secondary | ICD-10-CM

## 2020-11-21 DIAGNOSIS — E785 Hyperlipidemia, unspecified: Secondary | ICD-10-CM

## 2020-11-21 MED ORDER — NITROGLYCERIN 0.4 MG SL SUBL: 0.4000 mg | SUBLINGUAL_TABLET | SUBLINGUAL | 2 refills | Status: DC | PRN

## 2020-11-21 NOTE — Patient Instructions (Signed)
Medication Instructions:  Your physician has recommended you make the following change in your medication:   STOP Brilinta  Continue your other cardiac medication as prescribed.  Nitroglycerin has been refilled today. Use as directed.     *If you need a refill on your cardiac medications before your next appointment, please call your pharmacy*   Lab Work: None ordered If you have labs (blood work) drawn today and your tests are completely normal, you will receive your results only by: MyChart Message (if you have MyChart) OR A paper copy in the mail If you have any lab test that is abnormal or we need to change your treatment, we will call you to review the results.   Testing/Procedures: None ordered   Follow-Up: At [facility], you and your health needs are our priority.  As part of our continuing mission to provide you with exceptional heart care, we have created designated Provider Care Teams.  These Care Teams include your primary Cardiologist (physician) and Advanced Practice Providers (APPs -  Physician Assistants and Nurse Practitioners) who all work together to provide you with the care you need, when you need it.  We recommend signing up for the patient portal called "MyChart".  Sign up information is provided on this After Visit Summary.  MyChart is used to connect with patients for Virtual Visits (Telemedicine).  Patients are able to view lab/test results, encounter notes, upcoming appointments, etc.  Non-urgent messages can be sent to your provider as well.   To learn more about what you can do with MyChart, go to [email or web address].    Your next appointment:   Your physician wants you to follow-up in: 1 year You will receive a reminder letter in the mail two months in advance. If you don't receive a letter, please call our office to schedule the follow-up appointment.   The format for your next appointment:   In Person  Provider:   You may see Lorine Bears, MD or one of the following Advanced Practice Providers on your designated Care Team:   Nicolasa Ducking, NP Eula Listen, PA-C Marisue Ivan, PA-C Cadence Fransico Michael, [place]   Other Instructions N/A

## 2020-11-21 NOTE — Progress Notes (Signed)
Cardiology Office Note   Date:  11/21/2020   ID:  Victor Ibarra., DOB [birth year], MRN [id number]  PCP:  Patient, No Pcp Per (Inactive)  Cardiologist:   Lorine Bears, MD   Chief Complaint  Patient presents with   Other    12 month f/u pt would like to discuss Brilinta. Meds reviewed verbally with pt.      History of Present Illness: Victor Ibarra. is a 45 y.o. male who presents for a follow-up regarding coronary artery disease.  He was hospitalized in July of 2018 with non-ST elevation myocardial infarction.  Cardiac catheterization showed 60% distal LAD stenosis with possible plaque rupture.  This was treated with PCI and drug-eluting stent placement.  No other obstructive disease.  Ejection fraction was normal.  Other medical issues include hyperlipidemia and obesity.  He has been doing well with no recent chest pain, shortness of breath or palpitations.  He takes his medications regularly.  He reports easy bruising with Brilinta. He works as a Visual merchandiser and has been stressed about the lack of rain and excessive heat.    Past Medical History:  Diagnosis Date   CAD (coronary artery disease)    a. 11/2016 NSTEMI/PCI: LAD 60p w/ bridge.  FFR 0.81-->PCI performed 2/2 ACS/presumed plaque rupture (2.75x18 Xience Alpine DES). OTW nl cors, EF 55-65%; b. 10/2017 MV: EF 57%, no ischemia.   MVP (mitral valve prolapse)    a. 11/2014 Echo: EF 55-60%, mild LVh, inferolateral HK, mild MVP, mild MR, nl RV fxn.   Obesity    [facility] spotted fever    a. Dx 09/2016 - s/p 2 wks of doxycycline.   Tick bite     Past Surgical History:  Procedure Laterality Date   CORONARY STENT INTERVENTION N/A 11/19/2016   Procedure: Coronary Stent Intervention;  Surgeon: Yvonne Kendall, MD;  Location: [facility] INVASIVE CV LAB;  Service: Cardiovascular;  Laterality: N/A;   INTRAVASCULAR PRESSURE WIRE/FFR STUDY N/A 11/19/2016   Procedure: Intravascular Pressure Wire/FFR Study;  Surgeon:  Yvonne Kendall, MD;  Location: [facility] INVASIVE CV LAB;  Service: Cardiovascular;  Laterality: N/A;   LEFT HEART CATH AND CORONARY ANGIOGRAPHY N/A 11/19/2016   Procedure: Left Heart Cath and Coronary Angiography;  Surgeon: Yvonne Kendall, MD;  Location: [facility] INVASIVE CV LAB;  Service: Cardiovascular;  Laterality: N/A;     Current Outpatient Medications  Medication Sig Dispense Refill   aspirin 81 MG chewable tablet Chew 1 tablet (81 mg total) by mouth daily. 30 tablet 2   atorvastatin (LIPITOR) 40 MG tablet TAKE 1 TABLET BY MOUTH ONCE DAILY AT  6PM 90 tablet 0   metoprolol succinate (TOPROL-XL) 25 MG 24 hr tablet Take 1 tablet (25 mg total) by mouth daily. 90 tablet 3   nitroGLYCERIN (NITROSTAT) 0.4 MG SL tablet Place 1 tablet (0.4 mg total) under the tongue every 5 (five) minutes as needed for chest pain. 25 tablet 2   ticagrelor (BRILINTA) 60 MG TABS tablet Take 1 tablet (60 mg total) by mouth 2 (two) times daily. 180 tablet 3   valACYclovir (VALTREX) 1000 MG tablet Take 1 tablet (1,000 mg total) by mouth 3 (three) times daily. 21 tablet 0   No current facility-administered medications for this visit.    Allergies:   Patient has no known allergies.    Social History:  The patient  reports that he has quit smoking. His smokeless tobacco use includes chew. He reports current alcohol use. He reports that he does not  use drugs.   Family History:  The patient's family history includes CAD in his father; Hypertension in his mother; Melanoma in his father; Other in his mother and sister.    ROS:  Please see the history of present illness.   Otherwise, review of systems are positive for none.   All other systems are reviewed and negative.    PHYSICAL EXAM: VS:  BP 128/90 (BP Location: Left Arm, Patient Position: Sitting, Cuff Size: Normal)   Pulse 67   Ht 6' (1.829 m)   Wt 227 lb 6 oz (103.1 kg)   SpO2 98%   BMI 30.84 kg/m  , BMI Body mass index is 30.84 kg/m. GEN: Well nourished,  well developed, in no acute distress  HEENT: normal  Neck: no JVD, carotid bruits, or masses Cardiac: RRR; no murmurs, rubs, or gallops,no edema  Respiratory:  clear to auscultation bilaterally, normal work of breathing GI: soft, nontender, nondistended, + BS MS: no deformity or atrophy  Skin: warm and dry, no rash Neuro:  Strength and sensation are intact Psych: euthymic mood, full affect   EKG:  EKG is ordered today. The ekg ordered today demonstrates normal sinus rhythm with nonspecific T wave changes.   Recent Labs: 08/10/2020: ALT 29    Lipid Panel    Component Value Date/Time   CHOL 103 08/10/2020 0645   CHOL 90 (L) 11/15/2019 0834   TRIG 34 08/10/2020 0645   HDL 51 08/10/2020 0645   HDL 47 11/15/2019 0834   CHOLHDL 2.0 08/10/2020 0645   VLDL 7 08/10/2020 0645   LDLCALC 45 08/10/2020 0645   LDLCALC 32 11/15/2019 0834      Wt Readings from Last 3 Encounters:  11/21/20 227 lb 6 oz (103.1 kg)  07/10/20 220 lb (99.8 kg)  11/15/19 227 lb 8 oz (103.2 kg)       No flowsheet data found.    ASSESSMENT AND PLAN:   1.  Coronary artery disease involving native coronary arteries without angina: He is doing extremely well with no recurrent anginal symptoms.  It has been 4 years since his myocardial infarction and stent placement and he complains of easy bruising.  Thus, I elected to discontinue ticagrelor and continue low-dose aspirin indefinitely.   We refilled his sublingual nitroglycerin.  2.  Hyperlipidemia: I reviewed most recent lipid profile done in April which showed an LDL of 45.  Based on this, I advised him to continue atorvastatin 40 mg daily.    Disposition:   FU with me in 12 months  Signed,  Lorine Bears, MD  11/21/2020 3:10 PM    [facility]

## 2020-11-26 ENCOUNTER — Other Ambulatory Visit: Payer: Self-pay | Admitting: Family

## 2021-02-06 ENCOUNTER — Other Ambulatory Visit: Payer: Self-pay | Admitting: Family

## 2021-02-06 NOTE — Telephone Encounter (Signed)
Rx(s) sent to pharmacy electronically.  

## 2021-03-23 ENCOUNTER — Other Ambulatory Visit: Payer: Self-pay | Admitting: Family

## 2021-05-07 ENCOUNTER — Other Ambulatory Visit: Payer: Self-pay | Admitting: Family

## 2021-08-29 ENCOUNTER — Other Ambulatory Visit: Payer: Self-pay | Admitting: Cardiovascular Disease

## 2021-10-23 ENCOUNTER — Other Ambulatory Visit: Payer: Self-pay | Admitting: Cardiovascular Disease

## 2021-11-22 ENCOUNTER — Telehealth: Payer: Self-pay

## 2021-11-22 ENCOUNTER — Encounter: Payer: Self-pay | Admitting: Cardiovascular Disease

## 2021-11-22 ENCOUNTER — Other Ambulatory Visit
Admission: RE | Admit: 2021-11-22 | Discharge: 2021-11-22 | Disposition: A | Discharge status: Home / Self Care | Payer: 59 | Attending: Cardiovascular Disease | Admitting: Cardiovascular Disease

## 2021-11-22 ENCOUNTER — Ambulatory Visit (INDEPENDENT_AMBULATORY_CARE_PROVIDER_SITE_OTHER): Payer: 59 | Admitting: Cardiovascular Disease

## 2021-11-22 VITALS — BP 130/80 | HR 74 | Ht 72.0 in | Wt 246.5 lb

## 2021-11-22 DIAGNOSIS — I2510 Atherosclerotic heart disease of native coronary artery without angina pectoris: Secondary | ICD-10-CM | POA: Diagnosis not present

## 2021-11-22 DIAGNOSIS — E785 Hyperlipidemia, unspecified: Secondary | ICD-10-CM | POA: Diagnosis not present

## 2021-11-22 LAB — COMPREHENSIVE METABOLIC PANEL
ALT: 27 U/L (ref 0–44)
AST: 23 U/L (ref 15–41)
Albumin: 4.2 g/dL (ref 3.5–5.0)
Alkaline Phosphatase: 30 U/L — ABNORMAL LOW (ref 38–126)
Anion gap: 3 — ABNORMAL LOW (ref 5–15)
BUN: 12 mg/dL (ref 6–20)
CO2: 29 mmol/L (ref 22–32)
Calcium: 9.2 mg/dL (ref 8.9–10.3)
Chloride: 106 mmol/L (ref 98–111)
Creatinine, Ser: 1.07 mg/dL (ref 0.61–1.24)
GFR, Estimated: >60 mL/min (ref >60)
Glucose, Bld: 103 mg/dL — ABNORMAL HIGH (ref 70–99)
Potassium: 4.4 mmol/L (ref 3.5–5.1)
Sodium: 138 mmol/L (ref 135–145)
Total Bilirubin: 0.5 mg/dL (ref 0.3–1.2)
Total Protein: 7.4 g/dL (ref 6.5–8.1)

## 2021-11-22 LAB — CBC WITH DIFFERENTIAL/PLATELET
Abs Immature Granulocytes: 0.01 10*3/uL (ref 0.00–0.07)
Basophils Absolute: 0 10*3/uL (ref 0.0–0.1)
Basophils Relative: 1 %
Eosinophils Absolute: 0.3 10*3/uL (ref 0.0–0.5)
Eosinophils Relative: 4 %
HCT: 49.2 % (ref 39.0–52.0)
Hemoglobin: 15.9 g/dL (ref 13.0–17.0)
Immature Granulocytes: 0 %
Lymphocytes Relative: 23 %
Lymphs Abs: 1.4 10*3/uL (ref 0.7–4.0)
MCH: 28.9 pg (ref 26.0–34.0)
MCHC: 32.3 g/dL (ref 30.0–36.0)
MCV: 89.5 fL (ref 80.0–100.0)
Monocytes Absolute: 0.5 10*3/uL (ref 0.1–1.0)
Monocytes Relative: 8 %
Neutro Abs: 3.7 10*3/uL (ref 1.7–7.7)
Neutrophils Relative %: 64 %
Platelets: 190 10*3/uL (ref 150–400)
RBC: 5.5 MIL/uL (ref 4.22–5.81)
RDW: 12.5 % (ref 11.5–15.5)
WBC: 5.9 10*3/uL (ref 4.0–10.5)
nRBC: 0 % (ref 0.0–0.2)

## 2021-11-22 LAB — LIPID PANEL
Cholesterol: 100 mg/dL (ref 0–200)
HDL: 49 mg/dL (ref >40)
LDL Cholesterol: 47 mg/dL (ref 0–99)
Total CHOL/HDL Ratio: 2 RATIO
Triglycerides: 19 mg/dL (ref <150)
VLDL: 4 mg/dL (ref 0–40)

## 2021-11-22 MED ORDER — ATORVASTATIN CALCIUM 40 MG PO TABS: 40.0000 mg | ORAL_TABLET | Freq: Every day | ORAL | 3 refills | Status: DC

## 2021-11-22 MED ORDER — METOPROLOL SUCCINATE ER 25 MG PO TB24: 25.0000 mg | ORAL_TABLET | Freq: Every day | ORAL | 3 refills | Status: DC

## 2021-11-22 NOTE — Telephone Encounter (Signed)
Error

## 2021-11-22 NOTE — Progress Notes (Signed)
Cardiology Office Note   Date:  11/22/2021   ID:  Victor Bill., DOB 1975/12/27, MRN [id number]  PCP:  Patient, No Pcp Per  Cardiologist:   Lorine Bears, MD   Chief Complaint  Patient presents with   12 month follow up     "Doing well." Medications reviewed by the patient verbally.       History of Present Illness: Victor Ibarra. is a 46 y.o. male who presents for a follow-up regarding coronary artery disease.  He was hospitalized in July of 2018 with non-ST elevation myocardial infarction.  Cardiac catheterization showed 60% distal LAD stenosis with possible plaque rupture.  This was treated with PCI and drug-eluting stent placement.  No other obstructive disease.  Ejection fraction was normal.  Other medical issues include hyperlipidemia and obesity.  Brilinta was discontinued last year.  He has been doing very well with no chest pain, shortness of breath or palpitations.    Past Medical History:  Diagnosis Date   CAD (coronary artery disease)    a. 11/2016 NSTEMI/PCI: LAD 60p w/ bridge.  FFR 0.81-->PCI performed 2/2 ACS/presumed plaque rupture (2.75x18 Xience Alpine DES). OTW nl cors, EF 55-65%; b. 10/2017 MV: EF 57%, no ischemia.   MVP (mitral valve prolapse)    a. 11/2014 Echo: EF 55-60%, mild LVh, inferolateral HK, mild MVP, mild MR, nl RV fxn.   Obesity    [facility] spotted fever    a. Dx 09/2016 - s/p 2 wks of doxycycline.   Tick bite     Past Surgical History:  Procedure Laterality Date   CORONARY STENT INTERVENTION N/A 11/19/2016   Procedure: Coronary Stent Intervention;  Surgeon: Yvonne Kendall, MD;  Location: [facility] INVASIVE CV LAB;  Service: Cardiovascular;  Laterality: N/A;   INTRAVASCULAR PRESSURE WIRE/FFR STUDY N/A 11/19/2016   Procedure: Intravascular Pressure Wire/FFR Study;  Surgeon: Yvonne Kendall, MD;  Location: [facility] INVASIVE CV LAB;  Service: Cardiovascular;  Laterality: N/A;   LEFT HEART CATH AND CORONARY ANGIOGRAPHY  N/A 11/19/2016   Procedure: Left Heart Cath and Coronary Angiography;  Surgeon: Yvonne Kendall, MD;  Location: [facility] INVASIVE CV LAB;  Service: Cardiovascular;  Laterality: N/A;     Current Outpatient Medications  Medication Sig Dispense Refill   aspirin 81 MG chewable tablet Chew 1 tablet (81 mg total) by mouth daily. 30 tablet 2   atorvastatin (LIPITOR) 40 MG tablet TAKE 1 TABLET BY MOUTH ONCE DAILY AT  6PM 90 tablet 0   metoprolol succinate (TOPROL-XL) 25 MG 24 hr tablet Take 1 tablet by mouth once daily 90 tablet 0   nitroGLYCERIN (NITROSTAT) 0.4 MG SL tablet Place 1 tablet (0.4 mg total) under the tongue every 5 (five) minutes as needed for chest pain. 25 tablet 2   valACYclovir (VALTREX) 1000 MG tablet Take 1 tablet (1,000 mg total) by mouth 3 (three) times daily. (Patient not taking: Reported on 11/22/2021) 21 tablet 0   No current facility-administered medications for this visit.    Allergies:   Patient has no known allergies.    Social History:  The patient  reports that he has quit smoking. His smokeless tobacco use includes chew. He reports current alcohol use. He reports that he does not use drugs.   Family History:  The patient's family history includes CAD in his father; Hypertension in his mother; Melanoma in his father; Other in his mother and sister.    ROS:  Please see the history of present illness.   Otherwise,  review of systems are positive for none.   All other systems are reviewed and negative.    PHYSICAL EXAM: VS:  BP 130/80 (BP Location: Left Arm, Patient Position: Sitting, Cuff Size: Normal)   Pulse 74   Ht 6' (1.829 m)   Wt 246 lb 8 oz (111.8 kg)   SpO2 97%   BMI 33.43 kg/m  , BMI Body mass index is 33.43 kg/m. GEN: Well nourished, well developed, in no acute distress  HEENT: normal  Neck: no JVD, carotid bruits, or masses Cardiac: RRR; no murmurs, rubs, or gallops,no edema  Respiratory:  clear to auscultation bilaterally, normal work of  breathing GI: soft, nontender, nondistended, + BS MS: no deformity or atrophy  Skin: warm and dry, no rash Neuro:  Strength and sensation are intact Psych: euthymic mood, full affect   EKG:  EKG is ordered today. The ekg ordered today demonstrates normal sinus rhythm with inferior T wave changes suggestive of ischemia   Recent Labs: No results found for requested labs within last 365 days.    Lipid Panel    Component Value Date/Time   CHOL 103 08/10/2020 0645   CHOL 90 (L) 11/15/2019 0834   TRIG 34 08/10/2020 0645   HDL 51 08/10/2020 0645   HDL 47 11/15/2019 0834   CHOLHDL 2.0 08/10/2020 0645   VLDL 7 08/10/2020 0645   LDLCALC 45 08/10/2020 0645   LDLCALC 32 11/15/2019 0834      Wt Readings from Last 3 Encounters:  11/22/21 246 lb 8 oz (111.8 kg)  11/21/20 227 lb 6 oz (103.1 kg)  07/10/20 220 lb (99.8 kg)           No data to display            ASSESSMENT AND PLAN:   1.  Coronary artery disease involving native coronary arteries without angina: He is doing extremely well with no recurrent anginal symptoms.  Continue aspirin indefinitely.  I refilled Toprol.  2.  Hyperlipidemia: Most recent lipid profile showed an LDL of 45.  I refilled atorvastatin.  I requested labs including lipid and liver profile.  In addition, will get CBC and basic metabolic profile.   Disposition:   FU with me in 12 months  Signed,  Lorine Bears, MD  11/22/2021 8:19 AM    [facility]

## 2021-11-22 NOTE — Patient Instructions (Signed)
Medication Instructions:  Your physician recommends that you continue on your current medications as directed. Please refer to the Current Medication list given to you today.  Your cardiac medications have been refilled today.  *If you need a refill on your cardiac medications before your next appointment, please call your pharmacy*   Lab Work: Lipid, Cbc, Cmp today  Please have your labs drawn at the [facility]. Stop at the Registration desk to check in.  If you have labs (blood work) drawn today and your tests are completely normal, you will receive your results only by: MyChart Message (if you have MyChart) OR A paper copy in the mail If you have any lab test that is abnormal or we need to change your treatment, we will call you to review the results.   Testing/Procedures: None ordered   Follow-Up: At [facility], you and your health needs are our priority.  As part of our continuing mission to provide you with exceptional heart care, we have created designated Provider Care Teams.  These Care Teams include your primary Cardiologist (physician) and Advanced Practice Providers (APPs -  Physician Assistants and Nurse Practitioners) who all work together to provide you with the care you need, when you need it.  We recommend signing up for the patient portal called "MyChart".  Sign up information is provided on this After Visit Summary.  MyChart is used to connect with patients for Virtual Visits (Telemedicine).  Patients are able to view lab/test results, encounter notes, upcoming appointments, etc.  Non-urgent messages can be sent to your provider as well.   To learn more about what you can do with MyChart, go to [email or web address].    Your next appointment:   Your physician wants you to follow-up in: 1 year You will receive a reminder letter in the mail two months in advance. If you don't receive a letter, please call our office to schedule the follow-up  appointment.   The format for your next appointment:   In Person  Provider:   You may see Lorine Bears, MD or one of the following Advanced Practice Providers on your designated Care Team:   Nicolasa Ducking, NP Eula Listen, PA-C Cadence Fransico Michael, [place]      Other Instructions N/A  Important Information About Sugar

## 2022-09-06 ENCOUNTER — Other Ambulatory Visit: Payer: Self-pay | Admitting: Cardiovascular Disease

## 2022-09-06 DIAGNOSIS — I2510 Atherosclerotic heart disease of native coronary artery without angina pectoris: Secondary | ICD-10-CM

## 2022-11-06 ENCOUNTER — Other Ambulatory Visit: Payer: Self-pay | Admitting: Cardiovascular Disease

## 2023-02-06 ENCOUNTER — Encounter: Payer: Self-pay | Admitting: Cardiovascular Disease

## 2023-02-06 ENCOUNTER — Ambulatory Visit: Payer: 59 | Attending: Cardiovascular Disease | Admitting: Cardiovascular Disease

## 2023-02-06 VITALS — BP 120/82 | HR 67 | Ht 72.0 in | Wt 254.4 lb

## 2023-02-06 DIAGNOSIS — E785 Hyperlipidemia, unspecified: Secondary | ICD-10-CM

## 2023-02-06 DIAGNOSIS — I2510 Atherosclerotic heart disease of native coronary artery without angina pectoris: Secondary | ICD-10-CM

## 2023-02-06 MED ORDER — ATORVASTATIN CALCIUM 40 MG PO TABS: 40.0000 mg | ORAL_TABLET | Freq: Every day | ORAL | 3 refills | Status: DC

## 2023-02-06 MED ORDER — METOPROLOL SUCCINATE ER 25 MG PO TB24: 25.0000 mg | ORAL_TABLET | Freq: Every day | ORAL | 3 refills | Status: DC

## 2023-02-06 NOTE — Patient Instructions (Signed)
Medication Instructions:  No changes *If you need a refill on your cardiac medications before your next appointment, please call your pharmacy*   Lab Work: Your provider would like for you to have following labs drawn today CBC, CMET and Lipid.   If you have labs (blood work) drawn today and your tests are completely normal, you will receive your results only by: MyChart Message (if you have MyChart) OR A paper copy in the mail If you have any lab test that is abnormal or we need to change your treatment, we will call you to review the results.   Testing/Procedures: None ordered   Follow-Up: At [facility], you and your health needs are our priority.  As part of our continuing mission to provide you with exceptional heart care, we have created designated Provider Care Teams.  These Care Teams include your primary Cardiologist (physician) and Advanced Practice Providers (APPs -  Physician Assistants and Nurse Practitioners) who all work together to provide you with the care you need, when you need it.  We recommend signing up for the patient portal called "MyChart".  Sign up information is provided on this After Visit Summary.  MyChart is used to connect with patients for Virtual Visits (Telemedicine).  Patients are able to view lab/test results, encounter notes, upcoming appointments, etc.  Non-urgent messages can be sent to your provider as well.   To learn more about what you can do with MyChart, go to [email or web address].    Your next appointment:   12 month(s)  Provider:   You may see Lorine Bears, MD or one of the following Advanced Practice Providers on your designated Care Team:   Nicolasa Ducking, NP Eula Listen, PA-C Cadence Fransico Michael, PA-C Charlsie Quest, NP

## 2023-02-06 NOTE — Progress Notes (Signed)
Cardiology Office Note   Date:  02/06/2023   ID:  Victor Bill., DOB 07-Feb-1976, MRN [id number]  PCP:  Patient, No Pcp Per  Cardiologist:   Lorine Bears, MD   Chief Complaint  Patient presents with   12 month follow up     "Doing well." Medications reviewed by the patient verbally.       History of Present Illness: Victor Ibarra. is a 47 y.o. male who presents for a follow-up regarding coronary artery disease.  He was hospitalized in July of 2018 with non-ST elevation myocardial infarction.  Cardiac catheterization showed 60% distal LAD stenosis with possible plaque rupture.  This was treated with PCI and drug-eluting stent placement.  No other obstructive disease.  Ejection fraction was normal.  Other medical issues include hyperlipidemia and obesity.  He has been doing very well with no chest pain, shortness of breath or palpitations.  He takes his medications regularly.   Past Medical History:  Diagnosis Date   CAD (coronary artery disease)    a. 11/2016 NSTEMI/PCI: LAD 60p w/ bridge.  FFR 0.81-->PCI performed 2/2 ACS/presumed plaque rupture (2.75x18 Xience Alpine DES). OTW nl cors, EF 55-65%; b. 10/2017 MV: EF 57%, no ischemia.   MVP (mitral valve prolapse)    a. 11/2014 Echo: EF 55-60%, mild LVh, inferolateral HK, mild MVP, mild MR, nl RV fxn.   Obesity    [facility] spotted fever    a. Dx 09/2016 - s/p 2 wks of doxycycline.   Tick bite     Past Surgical History:  Procedure Laterality Date   CORONARY PRESSURE/FFR STUDY N/A 11/19/2016   Procedure: Intravascular Pressure Wire/FFR Study;  Surgeon: Yvonne Kendall, MD;  Location: [facility] INVASIVE CV LAB;  Service: Cardiovascular;  Laterality: N/A;   CORONARY STENT INTERVENTION N/A 11/19/2016   Procedure: Coronary Stent Intervention;  Surgeon: Yvonne Kendall, MD;  Location: [facility] INVASIVE CV LAB;  Service: Cardiovascular;  Laterality: N/A;   LEFT HEART CATH AND CORONARY ANGIOGRAPHY N/A 11/19/2016    Procedure: Left Heart Cath and Coronary Angiography;  Surgeon: Yvonne Kendall, MD;  Location: [facility] INVASIVE CV LAB;  Service: Cardiovascular;  Laterality: N/A;     Current Outpatient Medications  Medication Sig Dispense Refill   aspirin 81 MG chewable tablet Chew 1 tablet (81 mg total) by mouth daily. 30 tablet 2   atorvastatin (LIPITOR) 40 MG tablet Take 1 tablet by mouth once daily 90 tablet 0   metoprolol succinate (TOPROL-XL) 25 MG 24 hr tablet Take 1 tablet by mouth once daily 90 tablet 0   nitroGLYCERIN (NITROSTAT) 0.4 MG SL tablet DISSOLVE ONE TABLET UNDER THE TONGUE EVERY 5 MINUTES AS NEEDED FOR CHEST PAIN.  DO NOT EXCEED A TOTAL OF 3 DOSES IN 15 MINUTES 25 tablet 0   valACYclovir (VALTREX) 1000 MG tablet Take 1 tablet (1,000 mg total) by mouth 3 (three) times daily. (Patient not taking: Reported on 02/06/2023) 21 tablet 0   No current facility-administered medications for this visit.    Allergies:   Patient has no known allergies.    Social History:  The patient  reports that he has quit smoking. His smokeless tobacco use includes chew. He reports current alcohol use. He reports that he does not use drugs.   Family History:  The patient's family history includes CAD in his father; Hypertension in his mother; Melanoma in his father; Other in his mother and sister.    ROS:  Please see the history of present illness.  Otherwise, review of systems are positive for none.   All other systems are reviewed and negative.    PHYSICAL EXAM: VS:  BP 120/82 (BP Location: Left Arm, Patient Position: Sitting, Cuff Size: Normal)   Pulse 67   Ht 6' (1.829 m)   Wt 254 lb 6 oz (115.4 kg)   SpO2 97%   BMI 34.50 kg/m  , BMI Body mass index is 34.5 kg/m. GEN: Well nourished, well developed, in no acute distress  HEENT: normal  Neck: no JVD, carotid bruits, or masses Cardiac: RRR; no murmurs, rubs, or gallops,no edema  Respiratory:  clear to auscultation bilaterally, normal work of  breathing GI: soft, nontender, nondistended, + BS MS: no deformity or atrophy  Skin: warm and dry, no rash Neuro:  Strength and sensation are intact Psych: euthymic mood, full affect   EKG:  EKG is ordered today. The ekg ordered today demonstrates: Normal sinus rhythm Nonspecific ST and T wave abnormality When compared with ECG of 20-Nov-2016 05:45, No significant change was found    Recent Labs: No results found for requested labs within last 365 days.    Lipid Panel    Component Value Date/Time   CHOL 100 11/22/2021 0839   CHOL 90 (L) 11/15/2019 0834   TRIG 19 11/22/2021 0839   HDL 49 11/22/2021 0839   HDL 47 11/15/2019 0834   CHOLHDL 2.0 11/22/2021 0839   VLDL 4 11/22/2021 0839   LDLCALC 47 11/22/2021 0839   LDLCALC 32 11/15/2019 0834      Wt Readings from Last 3 Encounters:  02/06/23 254 lb 6 oz (115.4 kg)  11/22/21 246 lb 8 oz (111.8 kg)  11/21/20 227 lb 6 oz (103.1 kg)           No data to display            ASSESSMENT AND PLAN:   1.  Coronary artery disease involving native coronary arteries without angina: He is doing extremely well with no recurrent anginal symptoms.  Continue aspirin indefinitely.  I refilled Toprol.  2.  Hyperlipidemia: Most recent lipid profile showed an LDL of 45.  I refilled atorvastatin.  I requested labs including lipid and liver profile.  In addition, will get CBC and basic metabolic profile.  I discussed with him getting a primary care physician.  He is due for a colonoscopy.  Disposition:   FU with me in 12 months  Signed,  Lorine Bears, MD  02/06/2023 10:02 AM    [facility]

## 2023-02-07 ENCOUNTER — Ambulatory Visit: Payer: 59 | Admitting: Cardiovascular Disease

## 2023-02-07 LAB — LIPID PANEL
Chol/HDL Ratio: 2 {ratio} (ref 0.0–5.0)
Cholesterol, Total: 107 mg/dL (ref 100–199)
HDL: 54 mg/dL (ref >39)
LDL Chol Calc (NIH): 41 mg/dL (ref 0–99)
Triglycerides: 48 mg/dL (ref 0–149)
VLDL Cholesterol Cal: 12 mg/dL (ref 5–40)

## 2023-02-07 LAB — COMPREHENSIVE METABOLIC PANEL
ALT: 30 [IU]/L (ref 0–44)
AST: 21 [IU]/L (ref 0–40)
Albumin: 4.4 g/dL (ref 4.1–5.1)
Alkaline Phosphatase: 42 [IU]/L — ABNORMAL LOW (ref 44–121)
BUN/Creatinine Ratio: 13 (ref 9–20)
BUN: 14 mg/dL (ref 6–24)
Bilirubin Total: 0.4 mg/dL (ref 0.0–1.2)
CO2: 26 mmol/L (ref 20–29)
Calcium: 9.9 mg/dL (ref 8.7–10.2)
Chloride: 102 mmol/L (ref 96–106)
Creatinine, Ser: 1.1 mg/dL (ref 0.76–1.27)
Globulin, Total: 2.4 g/dL (ref 1.5–4.5)
Glucose: 92 mg/dL (ref 70–99)
Potassium: 4.7 mmol/L (ref 3.5–5.2)
Sodium: 142 mmol/L (ref 134–144)
Total Protein: 6.8 g/dL (ref 6.0–8.5)
eGFR: 83 mL/min/{1.73_m2} (ref >59)

## 2023-02-07 LAB — CBC
Hematocrit: 49.2 % (ref 37.5–51.0)
Hemoglobin: 16 g/dL (ref 13.0–17.7)
MCH: 29.6 pg (ref 26.6–33.0)
MCHC: 32.5 g/dL (ref 31.5–35.7)
MCV: 91 fL (ref 79–97)
Platelets: 202 10*3/uL (ref 150–450)
RBC: 5.4 x10E6/uL (ref 4.14–5.80)
RDW: 12.2 % (ref 11.6–15.4)
WBC: 8.3 10*3/uL (ref 3.4–10.8)

## 2023-12-30 ENCOUNTER — Other Ambulatory Visit: Payer: Self-pay | Admitting: Cardiovascular Disease

## 2023-12-30 DIAGNOSIS — I2510 Atherosclerotic heart disease of native coronary artery without angina pectoris: Secondary | ICD-10-CM

## 2024-01-21 ENCOUNTER — Other Ambulatory Visit: Payer: Self-pay | Admitting: Cardiovascular Disease

## 2024-03-25 ENCOUNTER — Ambulatory Visit: Attending: Cardiovascular Disease | Admitting: Cardiovascular Disease

## 2024-03-25 ENCOUNTER — Encounter: Payer: Self-pay | Admitting: Cardiovascular Disease

## 2024-03-25 VITALS — BP 118/78 | HR 67 | Ht 73.0 in | Wt 262.1 lb

## 2024-03-25 DIAGNOSIS — E785 Hyperlipidemia, unspecified: Secondary | ICD-10-CM | POA: Diagnosis not present

## 2024-03-25 DIAGNOSIS — I2510 Atherosclerotic heart disease of native coronary artery without angina pectoris: Secondary | ICD-10-CM | POA: Diagnosis not present

## 2024-03-25 DIAGNOSIS — Z0000 Encounter for general adult medical examination without abnormal findings: Secondary | ICD-10-CM | POA: Diagnosis not present

## 2024-03-25 NOTE — Progress Notes (Signed)
 Cardiology Office Note   Date:  03/25/2024   ID:  Victor Ibarra., DOB 1976/05/04, MRN [id number]  PCP:  Patient, No Pcp Per  Cardiologist:   Victor Cage, MD   Chief Complaint  Patient presents with   Follow-up    12 month f/u no complaints today. Meds reviewed verbally with pt.      History of Present Illness: Victor Braley. is a 48 y.o. male who presents for a follow-up regarding coronary artery disease.  He was hospitalized in July of 2018 with non-ST elevation myocardial infarction.  Cardiac catheterization showed 60% distal LAD stenosis with possible plaque rupture.  This was treated with PCI and drug-eluting stent placement.  No other obstructive disease.  Ejection fraction was normal.  Other medical issues include hyperlipidemia and obesity.  He has been doing very well with no chest pain, shortness of breath or palpitations.  He takes his medications regularly.   Past Medical History:  Diagnosis Date   CAD (coronary artery disease)    a. 11/2016 NSTEMI/PCI: LAD 60p w/ bridge.  FFR 0.81-->PCI performed 2/2 ACS/presumed plaque rupture (2.75x18 Xience Alpine DES). OTW nl cors, EF 55-65%; b. 10/2017 MV: EF 57%, no ischemia.   MVP (mitral valve prolapse)    a. 11/2014 Echo: EF 55-60%, mild LVh, inferolateral HK, mild MVP, mild MR, nl RV fxn.   Obesity    [facility] spotted fever    a. Dx 09/2016 - s/p 2 wks of doxycycline.   Tick bite     Past Surgical History:  Procedure Laterality Date   CORONARY PRESSURE/FFR STUDY N/A 11/19/2016   Procedure: Intravascular Pressure Wire/FFR Study;  Surgeon: Mady Bruckner, MD;  Location: [facility] INVASIVE CV LAB;  Service: Cardiovascular;  Laterality: N/A;   CORONARY STENT INTERVENTION N/A 11/19/2016   Procedure: Coronary Stent Intervention;  Surgeon: Mady Bruckner, MD;  Location: [facility] INVASIVE CV LAB;  Service: Cardiovascular;  Laterality: N/A;   LEFT HEART CATH AND CORONARY ANGIOGRAPHY N/A 11/19/2016    Procedure: Left Heart Cath and Coronary Angiography;  Surgeon: Mady Bruckner, MD;  Location: [facility] INVASIVE CV LAB;  Service: Cardiovascular;  Laterality: N/A;     Current Outpatient Medications  Medication Sig Dispense Refill   aspirin  81 MG chewable tablet Chew 1 tablet (81 mg total) by mouth daily. 30 tablet 2   atorvastatin  (LIPITOR) 40 MG tablet Take 1 tablet by mouth once daily 90 tablet 0   metoprolol  succinate (TOPROL -XL) 25 MG 24 hr tablet Take 1 tablet by mouth once daily 90 tablet 0   nitroGLYCERIN  (NITROSTAT ) 0.4 MG SL tablet DISSOLVE ONE TABLET UNDER THE TONGUE EVERY 5 MINUTES AS NEEDED FOR CHEST PAIN.  DO NOT EXCEED A TOTAL OF 3 DOSES IN 15 MINUTES 25 tablet 3   valACYclovir  (VALTREX ) 1000 MG tablet Take 1 tablet (1,000 mg total) by mouth 3 (three) times daily. (Patient not taking: Reported on 03/25/2024) 21 tablet 0   No current facility-administered medications for this visit.    Allergies:   Patient has no known allergies.    Social History:  The patient  reports that he has quit smoking. His smoking use included cigarettes. His smokeless tobacco use includes chew. He reports current alcohol use. He reports that he does not use drugs.   Family History:  The patient's family history includes CAD in his father; Hypertension in his mother; Melanoma in his father; Other in his mother and sister.    ROS:  Please see the history  of present illness.   Otherwise, review of systems are positive for none.   All other systems are reviewed and negative.    PHYSICAL EXAM: VS:  BP 118/78 (BP Location: Left Arm, Patient Position: Sitting, Cuff Size: Large)   Pulse 67   Ht 6' 1 (1.854 m)   Wt 262 lb 2 oz (118.9 kg)   SpO2 98%   BMI 34.58 kg/m  , BMI Body mass index is 34.58 kg/m. GEN: Well nourished, well developed, in no acute distress  HEENT: normal  Neck: no JVD, carotid bruits, or masses Cardiac: RRR; no murmurs, rubs, or gallops,no edema  Respiratory:  clear to  auscultation bilaterally, normal work of breathing GI: soft, nontender, nondistended, + BS MS: no deformity or atrophy  Skin: warm and dry, no rash Neuro:  Strength and sensation are intact Psych: euthymic mood, full affect   EKG:  EKG is ordered today. The ekg ordered today demonstrates: Normal sinus rhythm Nonspecific T wave abnormality When compared with ECG of 06-Feb-2023 09:50, No significant change was found   Recent Labs: No results found for requested labs within last 365 days.    Lipid Panel    Component Value Date/Time   CHOL 107 02/06/2023 1027   TRIG 48 02/06/2023 1027   HDL 54 02/06/2023 1027   CHOLHDL 2.0 02/06/2023 1027   CHOLHDL 2.0 11/22/2021 0839   VLDL 4 11/22/2021 0839   LDLCALC 41 02/06/2023 1027      Wt Readings from Last 3 Encounters:  03/25/24 262 lb 2 oz (118.9 kg)  02/06/23 254 lb 6 oz (115.4 kg)  11/22/21 246 lb 8 oz (111.8 kg)           No data to display            ASSESSMENT AND PLAN:   1.  Coronary artery disease involving native coronary arteries without angina: He is doing extremely well with no recurrent anginal symptoms.  Continue aspirin  indefinitely.  Meds were refilled.  2.  Hyperlipidemia: He is doing well with atorvastatin .  Most recent lipid profile showed an LDL of 41.  I requested follow-up labs today.  3.  Health maintenance: He still does not have a primary care physician today.  I discussed prostate cancer screening and will check PSA.  He is also due for colonoscopy and he is agreeable to proceed.  He is at low risk from a cardiac standpoint.   Disposition:   FU with me in 12 months  Signed,  Victor Cage, MD  03/25/2024 8:06 AM    [facility]

## 2024-03-25 NOTE — Patient Instructions (Signed)
 Medication Instructions:  No changes *If you need a refill on your cardiac medications before your next appointment, please call your pharmacy*  Lab Work: Your provider would like for you to have the following labs today: CBC, CMET, Lipid, PSA  If you have labs (blood work) drawn today and your tests are completely normal, you will receive your results only by: MyChart Message (if you have MyChart) OR A paper copy in the mail If you have any lab test that is abnormal or we need to change your treatment, we will call you to review the results.  Testing/Procedures: None ordered  Follow-Up: At [facility], you and your health needs are our priority.  As part of our continuing mission to provide you with exceptional heart care, our providers are all part of one team.  This team includes your primary Cardiologist (physician) and Advanced Practice Providers or APPs (Physician Assistants and Nurse Practitioners) who all work together to provide you with the care you need, when you need it.  Your next appointment:   12 month(s)  Provider:   You may see Deatrice Cage, MD or one of the following Advanced Practice Providers on your designated Care Team:   Lonni Meager, NP Lesley Maffucci, PA-C Bernardino Bring, PA-C Cadence [place], PA-C Tylene Lunch, NP Barnie Hila, NP    We recommend signing up for the patient portal called MyChart.  Sign up information is provided on this After Visit Summary.  MyChart is used to connect with patients for Virtual Visits (Telemedicine).  Patients are able to view lab/test results, encounter notes, upcoming appointments, etc.  Non-urgent messages can be sent to your provider as well.   To learn more about what you can do with MyChart, go to [email or web address].

## 2024-03-26 ENCOUNTER — Ambulatory Visit: Payer: Self-pay | Admitting: Cardiovascular Disease

## 2024-03-26 LAB — CBC
Hematocrit: 50.4 % (ref 37.5–51.0)
Hemoglobin: 15.9 g/dL (ref 13.0–17.7)
MCH: 28.8 pg (ref 26.6–33.0)
MCHC: 31.5 g/dL (ref 31.5–35.7)
MCV: 91 fL (ref 79–97)
Platelets: 205 x10E3/uL (ref 150–450)
RBC: 5.53 x10E6/uL (ref 4.14–5.80)
RDW: 12.4 % (ref 11.6–15.4)
WBC: 7 x10E3/uL (ref 3.4–10.8)

## 2024-03-26 LAB — COMPREHENSIVE METABOLIC PANEL WITH GFR
ALT: 43 IU/L (ref 0–44)
AST: 24 IU/L (ref 0–40)
Albumin: 4.3 g/dL (ref 4.1–5.1)
Alkaline Phosphatase: 46 IU/L — ABNORMAL LOW (ref 47–123)
BUN/Creatinine Ratio: 17 (ref 9–20)
BUN: 19 mg/dL (ref 6–24)
Bilirubin Total: 0.4 mg/dL (ref 0.0–1.2)
CO2: 25 mmol/L (ref 20–29)
Calcium: 9.7 mg/dL (ref 8.7–10.2)
Chloride: 100 mmol/L (ref 96–106)
Creatinine, Ser: 1.1 mg/dL (ref 0.76–1.27)
Globulin, Total: 2.4 g/dL (ref 1.5–4.5)
Glucose: 88 mg/dL (ref 70–99)
Potassium: 5.1 mmol/L (ref 3.5–5.2)
Sodium: 140 mmol/L (ref 134–144)
Total Protein: 6.7 g/dL (ref 6.0–8.5)
eGFR: 82 mL/min/1.73 (ref >59)

## 2024-03-26 LAB — LIPID PANEL
Chol/HDL Ratio: 2.2 ratio (ref 0.0–5.0)
Cholesterol, Total: 116 mg/dL (ref 100–199)
HDL: 52 mg/dL (ref >39)
LDL Chol Calc (NIH): 49 mg/dL (ref 0–99)
Triglycerides: 75 mg/dL (ref 0–149)
VLDL Cholesterol Cal: 15 mg/dL (ref 5–40)

## 2024-03-26 LAB — PSA: Prostate Specific Ag, Serum: 0.6 ng/mL (ref 0.0–4.0)

## 2024-03-30 ENCOUNTER — Telehealth: Payer: Self-pay

## 2024-03-30 ENCOUNTER — Other Ambulatory Visit: Payer: Self-pay

## 2024-03-30 DIAGNOSIS — Z1211 Encounter for screening for malignant neoplasm of colon: Secondary | ICD-10-CM

## 2024-03-30 MED ORDER — NA SULFATE-K SULFATE-MG SULF 17.5-3.13-1.6 GM/177ML PO SOLN: 1.0000 | Freq: Once | ORAL | 0 refills | Status: AC

## 2024-03-30 NOTE — Telephone Encounter (Signed)
 Gastroenterology Pre-Procedure Review  Request Date: 06/17/24 Requesting Physician: Dr. Jinny  PATIENT REVIEW QUESTIONS: The patient responded to the following health history questions as indicated:    1. Are you having any GI issues? no 2. Do you have a personal history of Polyps? no 3. Do you have a family history of Colon Cancer or Polyps? no 4. Diabetes Mellitus? no 5. Joint replacements in the past 12 months?no 6. Major health problems in the past 3 months?no 7. Any artificial heart valves, MVP, or defibrillator?no 8. Cardiac issues? NSTEMI clearance sent to heart care    MEDICATIONS & ALLERGIES:    Patient reports the following regarding taking any anticoagulation/antiplatelet therapy:   Plavix, Coumadin, Eliquis, Xarelto, Lovenox , Pradaxa, Brilinta , or Effient? no Aspirin ? yes (81 mg daily)  Patient confirms/reports the following medications:  Current Outpatient Medications  Medication Sig Dispense Refill   aspirin  81 MG chewable tablet Chew 1 tablet (81 mg total) by mouth daily. 30 tablet 2   atorvastatin  (LIPITOR) 40 MG tablet Take 1 tablet by mouth once daily 90 tablet 0   metoprolol  succinate (TOPROL -XL) 25 MG 24 hr tablet Take 1 tablet by mouth once daily 90 tablet 0   nitroGLYCERIN  (NITROSTAT ) 0.4 MG SL tablet DISSOLVE ONE TABLET UNDER THE TONGUE EVERY 5 MINUTES AS NEEDED FOR CHEST PAIN.  DO NOT EXCEED A TOTAL OF 3 DOSES IN 15 MINUTES 25 tablet 3   valACYclovir  (VALTREX ) 1000 MG tablet Take 1 tablet (1,000 mg total) by mouth 3 (three) times daily. (Patient not taking: Reported on 03/25/2024) 21 tablet 0   No current facility-administered medications for this visit.    Patient confirms/reports the following allergies:  No Known Allergies  No orders of the defined types were placed in this encounter.   AUTHORIZATION INFORMATION Primary Insurance: 1D#: Group #:  Secondary Insurance: 1D#: Group #:  SCHEDULE INFORMATION: Date: 06/17/24 Time: Location: [facility]

## 2024-03-31 ENCOUNTER — Telehealth (HOSPITAL_BASED_OUTPATIENT_CLINIC_OR_DEPARTMENT_OTHER): Payer: Self-pay | Admitting: *Deleted

## 2024-03-31 NOTE — Telephone Encounter (Signed)
   Pre-operative Risk Assessment    Patient Name: Victor Ibarra.  DOB: 1975/10/14 MRN: [id number]   Date of last office visit: 03/25/24 DR. ARIDA Date of next office visit: NONE   Request for Surgical Clearance    Procedure:  COLONOSCOPY  Date of Surgery:  Clearance 06/17/24                                Surgeon:  DR. DARREN WOHL Surgeon's Group or Practice Name:  [facility] Phone number:  [phone] [name], CMA Fax number:  [phone]   Type of Clearance Requested:   - Medical    Type of Anesthesia:  General    Additional requests/questions:    [name]   03/31/2024, 12:00 PM

## 2024-04-05 NOTE — Telephone Encounter (Signed)
 Clearance Granted Per Heart Care Note dated 03/25/24.  ASSESSMENT AND PLAN:     1.  Coronary artery disease involving native coronary arteries without angina: He is doing extremely well with no recurrent anginal symptoms.  Continue aspirin  indefinitely.  Meds were refilled.   2.  Hyperlipidemia: He is doing well with atorvastatin .  Most recent lipid profile showed an LDL of 41.  I requested follow-up labs today.   3.  Health maintenance: He still does not have a primary care physician today.  I discussed prostate cancer screening and will check PSA.  He is also due for colonoscopy and he is agreeable to proceed.  He is at low risk from a cardiac standpoint.    Thanks,  Victor Ibarra, CMA

## 2024-04-12 ENCOUNTER — Other Ambulatory Visit: Payer: Self-pay | Admitting: Cardiovascular Disease

## 2024-04-21 ENCOUNTER — Encounter: Payer: Self-pay | Admitting: Cardiovascular Disease

## 2024-06-17 ENCOUNTER — Ambulatory Visit: Admit: 2024-06-17 | Payer: Self-pay | Admitting: Gastroenterology

## 2024-06-17 SURGERY — COLONOSCOPY
Anesthesia: General
